# Patient Record
Sex: Female | Born: 1990 | Race: White | Hispanic: No | Marital: Single | Smoking: Never smoker
Health system: Southern US, Community
[De-identification: ages and names within clinical notes are randomized; demographics above are authoritative.]

## PROBLEM LIST (undated history)

## (undated) DIAGNOSIS — F329 Major depressive disorder, single episode, unspecified: Secondary | ICD-10-CM

## (undated) DIAGNOSIS — F419 Anxiety disorder, unspecified: Secondary | ICD-10-CM

## (undated) DIAGNOSIS — F32A Depression, unspecified: Secondary | ICD-10-CM

## (undated) HISTORY — DX: Depression, unspecified: F32.A

## (undated) HISTORY — DX: Major depressive disorder, single episode, unspecified: F32.9

## (undated) HISTORY — DX: Anxiety disorder, unspecified: F41.9

## (undated) HISTORY — PX: KNEE SURGERY: SHX244

---
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
## 2011-01-23 ENCOUNTER — Telehealth: Payer: Self-pay | Admitting: *Deleted

## 2011-01-23 NOTE — Telephone Encounter (Signed)
Patient called wanted to discuss her birth control with you.  She said she is about to loose insurance.

## 2011-01-23 NOTE — Telephone Encounter (Signed)
Jill Pham-Is losing her insurance but will hopefully get back in January. States one was on pills she was nauseous, has done well with nuva ring. Will pick up  2 samples.

## 2011-01-23 NOTE — Telephone Encounter (Signed)
Message left on cell to call 

## 2012-09-11 ENCOUNTER — Emergency Department (HOSPITAL_COMMUNITY)
Admission: EM | Admit: 2012-09-11 | Discharge: 2012-09-11 | Disposition: A | Discharge status: Home / Self Care | Payer: 59 | Attending: Emergency Medicine | Admitting: Emergency Medicine

## 2012-09-11 ENCOUNTER — Emergency Department (HOSPITAL_COMMUNITY): Payer: 59

## 2012-09-11 ENCOUNTER — Encounter (HOSPITAL_COMMUNITY): Payer: Self-pay | Admitting: *Deleted

## 2012-09-11 DIAGNOSIS — S0990XA Unspecified injury of head, initial encounter: Secondary | ICD-10-CM | POA: Insufficient documentation

## 2012-09-11 DIAGNOSIS — R110 Nausea: Secondary | ICD-10-CM | POA: Insufficient documentation

## 2012-09-11 DIAGNOSIS — R42 Dizziness and giddiness: Secondary | ICD-10-CM | POA: Insufficient documentation

## 2012-09-11 DIAGNOSIS — W64XXXA Exposure to other animate mechanical forces, initial encounter: Secondary | ICD-10-CM | POA: Insufficient documentation

## 2012-09-11 DIAGNOSIS — Y9389 Activity, other specified: Secondary | ICD-10-CM | POA: Insufficient documentation

## 2012-09-11 DIAGNOSIS — S0993XA Unspecified injury of face, initial encounter: Secondary | ICD-10-CM | POA: Insufficient documentation

## 2012-09-11 DIAGNOSIS — Y929 Unspecified place or not applicable: Secondary | ICD-10-CM | POA: Insufficient documentation

## 2012-09-11 MED ORDER — ONDANSETRON 4 MG PO TBDP
4.0000 mg | ORAL_TABLET | Freq: Once | ORAL | Status: AC
Start: 2012-09-11 — End: 2012-09-11
  Administered 2012-09-11: 4 mg via ORAL
  Filled 2012-09-11: qty 1

## 2012-09-11 MED ORDER — IBUPROFEN 800 MG PO TABS
800.0000 mg | ORAL_TABLET | Freq: Once | ORAL | Status: AC
  Administered 2012-09-11: 800 mg via ORAL
  Filled 2012-09-11: qty 1

## 2012-09-11 NOTE — ED Provider Notes (Signed)
History  This chart was scribed for Jill Lennert, MD by Bennett Scrape, ED Scribe. This patient was seen in room APA12/APA12 and the patient's care was started at 6:11 PM.  CSN: [id number]  Arrival date & time 09/11/12  1638   First MD Initiated Contact with Patient 09/11/12 1811      Chief Complaint  Patient presents with  . Head Injury     Patient is a 22 y.o. female presenting with head injury. The history is provided by the patient. No language interpreter was used.  Head Injury Location:  L parietal Time since incident:  1 hour Mechanism of injury: direct blow   Pain details:    Timing:  Constant Chronicity:  New Relieved by:  Nothing Worsened by:  Nothing tried Ineffective treatments:  None tried Associated symptoms: headache, nausea and neck pain   Associated symptoms: no blurred vision, no disorientation, no double vision, no loss of consciousness, no seizures and no vomiting     HPI Comments: Jill Pham is a 22 y.o. female who presents to the Emergency Department complaining of a head injury that occurred PTA. Pt was standing behind a horse when she was kicked in the left parietal area. She c/o associated left-sided HA, dizziness, neck pain and nausea. Pt denies LOC but says that she was knocked to the ground. She denies emesis, disorientation and visual disturbances as associated symptoms. Pt does not have a h/o chronic medical conditions.  History reviewed. No pertinent past medical history.  Past Surgical History  Procedure Laterality Date  . Knee surgery      No family history on file.  History  Substance Use Topics  . Smoking status: Never Smoker   . Smokeless tobacco: Not on file  . Alcohol Use: No    No OB history provided.  Review of Systems  Constitutional: Negative for appetite change and fatigue.  HENT: Positive for neck pain. Negative for congestion, sinus pressure and ear discharge.   Eyes: Negative for blurred vision, double  vision and discharge.  Respiratory: Negative for cough.   Cardiovascular: Negative for chest pain.  Gastrointestinal: Positive for nausea. Negative for vomiting, abdominal pain and diarrhea.  Genitourinary: Negative for frequency and hematuria.  Musculoskeletal: Negative for back pain.  Skin: Negative for rash.  Neurological: Positive for dizziness and headaches. Negative for seizures and loss of consciousness.  Psychiatric/Behavioral: Negative for hallucinations.    Allergies  Latex  Home Medications   Current Outpatient Rx  Name  Route  Sig  Dispense  Refill  . etonogestrel-ethinyl estradiol (NUVARING) 0.12-0.015 MG/24HR vaginal ring   Vaginal   Place 1 each vaginally every 28 (twenty-eight) days. Insert vaginally and leave in place for 3 consecutive weeks, then remove for 1 week.           Triage Vitals: BP 128/76  Pulse 97  Temp(Src) 98.2 F (36.8 C) (Oral)  Resp 20  Wt 105 lb (47.628 kg)  SpO2 99%  LMP 09/11/2012  Physical Exam  Nursing note and vitals reviewed. Constitutional: She is oriented to person, place, and time. She appears well-developed and well-nourished.  HENT:  Head: Normocephalic.  Tenderness to the vertex of the head with associated mild swelling  Eyes: Conjunctivae and EOM are normal. No scleral icterus.  Neck: Neck supple. No thyromegaly present.  Tenderness to posterior neck  Cardiovascular: Normal rate and regular rhythm.  Exam reveals no gallop and no friction rub.   No murmur heard. Pulmonary/Chest: Effort normal and breath  sounds normal. No stridor. She has no wheezes. She has no rales. She exhibits no tenderness.  Abdominal: Soft. She exhibits no distension. There is no tenderness. There is no rebound.  Musculoskeletal: Normal range of motion. She exhibits no edema.  Lymphadenopathy:    She has no cervical adenopathy.  Neurological: She is alert and oriented to person, place, and time. Coordination normal.  Skin: Skin is warm and dry.  No rash noted. No erythema.  Psychiatric: She has a normal mood and affect. Her behavior is normal.    ED Course  Procedures (including critical care time)  Medications  ondansetron (ZOFRAN-ODT) disintegrating tablet 4 mg (not administered)    DIAGNOSTIC STUDIES: Oxygen Saturation is 99% on room air, normal by my interpretation.    COORDINATION OF CARE: 6:19 PM-Discussed treatment plan which includes Zofran, CT of head and CT of c-spine with pt at bedside and pt agreed to plan.   8:09 PM-Informed pt of negative radiology results. Discussed discharge plan which includes OTC medications for pain with pt and pt agreed to plan. Also advised pt to follow up with PCP as needed and pt agreed. Addressed symptoms to return for with pt.   Labs Reviewed - No data to display  Ct Head Wo Contrast  09/11/2012   *RADIOLOGY REPORT*  Clinical Data:  Kicked in the left side of skull by horse. Symptoms of headache, dizziness, syncope and neck pain.  CT HEAD WITHOUT CONTRAST CT CERVICAL SPINE WITHOUT CONTRAST  Technique:  Multidetector CT imaging of the head and cervical spine was performed following the standard protocol without intravenous contrast.  Multiplanar CT image reconstructions of the cervical spine were also generated.  Comparison:   None  CT HEAD  Findings: The brain demonstrates no evidence of hemorrhage, infarction, edema, mass effect, extra-axial fluid collection, hydrocephalus or mass lesion.  The skull is unremarkable.  IMPRESSION: Normal head CT.  CT CERVICAL SPINE  Findings: The cervical spine shows normal alignment and no evidence of fracture or subluxation.  No soft tissue swelling or hematoma is identified.  The visualized airway is normally patent.  No degenerative changes are identified.  No bony lesions are seen.  IMPRESSION: Normal cervical spine CT.   Original Report Authenticated By: Irish Lack, M.D.   Ct Cervical Spine Wo Contrast  09/11/2012   *RADIOLOGY REPORT*  Clinical Data:   Kicked in the left side of skull by horse. Symptoms of headache, dizziness, syncope and neck pain.  CT HEAD WITHOUT CONTRAST CT CERVICAL SPINE WITHOUT CONTRAST  Technique:  Multidetector CT imaging of the head and cervical spine was performed following the standard protocol without intravenous contrast.  Multiplanar CT image reconstructions of the cervical spine were also generated.  Comparison:   None  CT HEAD  Findings: The brain demonstrates no evidence of hemorrhage, infarction, edema, mass effect, extra-axial fluid collection, hydrocephalus or mass lesion.  The skull is unremarkable.  IMPRESSION: Normal head CT.  CT CERVICAL SPINE  Findings: The cervical spine shows normal alignment and no evidence of fracture or subluxation.  No soft tissue swelling or hematoma is identified.  The visualized airway is normally patent.  No degenerative changes are identified.  No bony lesions are seen.  IMPRESSION: Normal cervical spine CT.   Original Report Authenticated By: Irish Lack, M.D.     No diagnosis found.    MDM      The chart was scribed for me under my direct supervision.  I personally performed the history, physical, and medical  decision making and all procedures in the evaluation of this patient.Jill Lennert, MD 09/11/12 2013

## 2012-09-11 NOTE — ED Notes (Signed)
Pt was kicked in head by a horse, c/o headache, dizziness, nausea. Unsure of any LOC.

## 2012-09-15 ENCOUNTER — Encounter: Payer: Self-pay | Admitting: General Practice

## 2012-09-15 ENCOUNTER — Ambulatory Visit (INDEPENDENT_AMBULATORY_CARE_PROVIDER_SITE_OTHER): Payer: 59 | Admitting: General Practice

## 2012-09-15 VITALS — BP 114/72 | HR 73 | Temp 99.0°F | Ht 61.0 in | Wt 118.0 lb

## 2012-09-15 DIAGNOSIS — IMO0001 Reserved for inherently not codable concepts without codable children: Secondary | ICD-10-CM

## 2012-09-15 DIAGNOSIS — Z30011 Encounter for initial prescription of contraceptive pills: Secondary | ICD-10-CM

## 2012-09-15 DIAGNOSIS — Z3009 Encounter for other general counseling and advice on contraception: Secondary | ICD-10-CM

## 2012-09-15 DIAGNOSIS — Z309 Encounter for contraceptive management, unspecified: Secondary | ICD-10-CM

## 2012-09-15 MED ORDER — NORGESTIM-ETH ESTRAD TRIPHASIC 0.18/0.215/0.25 MG-35 MCG PO TABS: 1.0000 | ORAL_TABLET | Freq: Every day | ORAL | Status: DC

## 2012-09-15 NOTE — Patient Instructions (Addendum)
Contraception Choices Birth control (contraception) can stop pregnancy from happening. Different types of birth control work in different ways. Some can:  Make the mucus in the cervix thick. This makes it hard for sperm to get into the uterus.  Thin the lining of the uterus. This makes it hard for an egg to attach to the wall of the uterus.  Stop the ovaries from releasing an egg.  Block the sperm from reaching the egg. Certain types of surgery can stop pregnancy from happening. For women, the sugery closes the fallopian tubes (tubal ligation). For men, the surgery stops sperm from releasing during sex (vasectomy). HORMONAL BIRTH CONTROL Hormonal birth control stops pregnancy by putting hormones into your body. Types of birth control include:  A small tube put under the skin of the upper arm (implant). The tube can stay in place for 3 years.  Shots given every 3 months.  Pills taken every day or once after sex (intercourse).  Patches that are changed once a week.  A ring put into the vagina (vaginal ring). The ring is left in place for 3 weeks and removed for 1 week. Then, a new ring is put in the vagina. BARRIER BIRTH CONTROL  Barrier birth control blocks sperm from reaching the egg. Types of birth control include:   A thin covering worn on the penis (female condom) during sex.  A soft, loose covering put into the vagina (female condom) before sex.  A rubber bowl that sits over the cervix (diaphragm). The bowl must be made for you. The bowl is put into the vagina before sex. The bowl is left in place for 6 to 8 hours after sex.  A small, soft cup that fits over the cervix (cervical cap). The cup must be made for you. The cup can be left in place for 48 hours after sex.  A sponge that is put into the vagina before sex.  A chemical that kills or blocks sperm from getting into the cervix and uterus (spermicide). The chemical may be a cream, jelly, foam, or pill. INTRAUTERINE (IUD)  BIRTH CONTROL  IUD birth control is a small, T-shaped piece of plastic. The plastic is put inside the uterus. There are 2 types of IUD:  Copper IUD. The IUD is covered in copper wire. The copper makes a fluid that kills sperm. It can stay in place for 10 years.  Hormone IUD. The hormone stops pregnancy from happening. It can stay in place for 5 years. NATURAL FAMILY PLANNING BIRTH CONTROL  Natural family planning means not having sex or using barrier birth control when the woman is fertile. A woman can:  Use a calendar to keep track of when she is fertile.  Use a thermometer to measure her body temperature. Protect yourself against sexual diseases no matter what type of birth control you use. Talk to your doctor about which type of birth control is best for you. Document Released: 01/18/2009 Document Revised: 06/15/2011 Document Reviewed: 07/30/2010 [facility] Patient Information 2014 [place], [place].

## 2012-09-15 NOTE — Progress Notes (Signed)
  Subjective:    Patient ID: Jill Pham, female    DOB: 11/21/1990, 21 y.o.   MRN: [id number]  HPI Patient presents today to discuss and possible change her birth control method. She is currently using the nuvaring and finds it expensive. She reports using as directed and denies known pregnancy. She reports last menstrual cycle started on September 08, 2012. Denies smoking.    Review of Systems  Constitutional: Negative for fever and chills.  HENT: Negative for neck pain.   Respiratory: Negative for chest tightness and shortness of breath.   Cardiovascular: Negative for chest pain and palpitations.  Gastrointestinal: Negative for abdominal pain and blood in stool.  Genitourinary: Negative for vaginal bleeding, vaginal discharge, difficulty urinating, vaginal pain and pelvic pain.  Musculoskeletal: Negative for myalgias and back pain.  Neurological: Negative for dizziness, syncope, weakness and headaches.       Objective:   Physical Exam  Constitutional: She is oriented to person, place, and time. She appears well-developed and well-nourished.  HENT:  Head: Normocephalic and atraumatic.  Cardiovascular: Normal rate, regular rhythm and normal heart sounds.   Pulmonary/Chest: Effort normal and breath sounds normal. No respiratory distress. She exhibits no tenderness.  Abdominal: Soft. Bowel sounds are normal. She exhibits no distension. There is no tenderness.  Neurological: She is alert and oriented to person, place, and time.  Skin: Skin is warm and dry.  Psychiatric: She has a normal mood and affect.   Results for orders placed in visit on 09/15/12  POCT URINE PREGNANCY      Result Value Range   Preg Test, Ur Negative           Assessment & Plan:  1. Birth control and 2. BCP (birth control pills) initiation - POCT urine pregnancy - Norgestimate-Ethinyl Estradiol Triphasic 0.18/0.215/0.25 MG-35 MCG tablet; Take 1 tablet by mouth daily.  Dispense: 3 Package; Refill:  3 -discussed cardiac risks, especially if smoking -discussed when to start taking birth control medications -discussed how to take medication if dose missed and use back up contraceptive method -Patient verbalized understanding -Coralie Keens, FNP-C

## 2012-10-11 ENCOUNTER — Telehealth: Payer: Self-pay | Admitting: Family Medicine

## 2012-10-11 NOTE — Telephone Encounter (Signed)
appt made for wc rck

## 2012-12-12 ENCOUNTER — Telehealth: Payer: Self-pay | Admitting: General Practice

## 2012-12-12 NOTE — Telephone Encounter (Signed)
Advised patient that no more appts available for today. Offered appt for tomorrow. Wanted to ask if antibiotic can be called in for her because she has such a long history of strep infections and she is sure that is what it is. I advised that we normally don't call in abts without patient being seen but patient requested that provider be asked

## 2012-12-13 ENCOUNTER — Ambulatory Visit (INDEPENDENT_AMBULATORY_CARE_PROVIDER_SITE_OTHER): Payer: PRIVATE HEALTH INSURANCE | Admitting: Nurse Practitioner

## 2012-12-13 VITALS — BP 106/73 | HR 92 | Temp 99.0°F | Ht 61.0 in | Wt 116.0 lb

## 2012-12-13 DIAGNOSIS — J039 Acute tonsillitis, unspecified: Secondary | ICD-10-CM

## 2012-12-13 DIAGNOSIS — J029 Acute pharyngitis, unspecified: Secondary | ICD-10-CM

## 2012-12-13 LAB — POCT RAPID STREP A (OFFICE): Rapid Strep A Screen: NEGATIVE

## 2012-12-13 MED ORDER — AMOXICILLIN 875 MG PO TABS: 875.0000 mg | ORAL_TABLET | Freq: Two times a day (BID) | ORAL | Status: DC

## 2012-12-13 NOTE — Progress Notes (Signed)
  Subjective:    Patient ID: Jill Pham, female    DOB: 1990/10/02, 21 y.o.   MRN: [id number]  HPI patient in C/O sore throat that started this past Saturday and has gradually gotten worse. Hurts to swallow. Low grade fever. Woke up with bilateral ear pain this morning. Dayquil and nitequil OTC with no relief.    Review of Systems  Constitutional: Positive for fever. Negative for appetite change and unexpected weight change.  HENT: Positive for ear pain, sore throat and trouble swallowing. Negative for congestion, rhinorrhea, sneezing, voice change, postnasal drip, sinus pressure, tinnitus and ear discharge.   Respiratory: Negative for cough.   All other systems reviewed and are negative.       Objective:   Physical Exam  Constitutional: She appears well-developed and well-nourished.  HENT:  Right Ear: Hearing, tympanic membrane, external ear and ear canal normal.  Left Ear: Hearing, tympanic membrane, external ear and ear canal normal.  Nose: Mucosal edema and rhinorrhea present. Right sinus exhibits no maxillary sinus tenderness and no frontal sinus tenderness. Left sinus exhibits no maxillary sinus tenderness and no frontal sinus tenderness.  Mouth/Throat: Oropharyngeal exudate, posterior oropharyngeal edema and posterior oropharyngeal erythema present.  Neck: Normal range of motion. Neck supple.  Cardiovascular: Normal rate, regular rhythm and normal heart sounds.   Pulmonary/Chest: Effort normal and breath sounds normal.  Skin: Skin is warm and dry.    BP 106/73  Pulse 92  Temp(Src) 99 F (37.2 C) (Oral)  Ht 5\' 1"  (1.549 m)  Wt 116 lb (52.617 kg)  BMI 21.93 kg/m2  LMP 11/13/2012 Results for orders placed in visit on 12/13/12  POCT RAPID STREP A (OFFICE)      Result Value Range   Rapid Strep A Screen Negative  Negative         Assessment & Plan:   1. Tonsillitis with exudate   2. Sore throat    Meds ordered this encounter  Medications  . amoxicillin  (AMOXIL) 875 MG tablet    Sig: Take 1 tablet (875 mg total) by mouth 2 (two) times daily.    Dispense:  20 tablet    Refill:  0    Order Specific Question:  Supervising Provider    Answer:  Deborra Medina  Force fluids Motrin or tylenol OTC OTC decongestant Throat lozenges if help New toothbrush in 3 days Mary-Margaret Daphine Deutscher, FNP

## 2012-12-13 NOTE — Patient Instructions (Signed)
Force fluids °Motrin or tylenol OTC °OTC decongestant °Throat lozenges if help °New toothbrush in 3 days ° °

## 2013-01-04 ENCOUNTER — Other Ambulatory Visit: Payer: Self-pay | Admitting: Obstetrics & Gynecology

## 2013-01-04 DIAGNOSIS — O3680X0 Pregnancy with inconclusive fetal viability, not applicable or unspecified: Secondary | ICD-10-CM

## 2013-01-10 ENCOUNTER — Other Ambulatory Visit: Payer: PRIVATE HEALTH INSURANCE

## 2013-01-10 ENCOUNTER — Encounter: Payer: PRIVATE HEALTH INSURANCE | Admitting: Women's Health

## 2014-02-13 ENCOUNTER — Other Ambulatory Visit: Payer: Self-pay | Admitting: Family Medicine

## 2014-02-13 NOTE — Telephone Encounter (Signed)
Patient aware to make an appointment she has already paid her bill paid it this morning. She said she will call back to make an appointment with a female Jill Pham only does paps on Tuesday.

## 2014-02-13 NOTE — Telephone Encounter (Signed)
NTBS for PAP 

## 2014-02-13 NOTE — Telephone Encounter (Signed)
Please advise the birth control ? Patient is dismissed from this department. Owes $126.07 if she pays it she can be seen.

## 2014-02-20 ENCOUNTER — Ambulatory Visit (INDEPENDENT_AMBULATORY_CARE_PROVIDER_SITE_OTHER): Payer: Managed Care, Other (non HMO) | Admitting: Nurse Practitioner

## 2014-02-20 ENCOUNTER — Encounter: Payer: Self-pay | Admitting: Nurse Practitioner

## 2014-02-20 VITALS — BP 122/79 | HR 91 | Temp 99.1°F | Ht 61.0 in | Wt 133.0 lb

## 2014-02-20 DIAGNOSIS — Z01419 Encounter for gynecological examination (general) (routine) without abnormal findings: Secondary | ICD-10-CM

## 2014-02-20 DIAGNOSIS — R7309 Other abnormal glucose: Secondary | ICD-10-CM

## 2014-02-20 LAB — POCT HEMOGLOBIN: Hemoglobin: 12.8 g/dL (ref 12.2–16.2)

## 2014-02-20 MED ORDER — ETONOGESTREL-ETHINYL ESTRADIOL 0.12-0.015 MG/24HR VA RING: VAGINAL_RING | VAGINAL | Status: DC

## 2014-02-20 NOTE — Progress Notes (Signed)
   Subjective:    Patient ID: Jill Pham, female    DOB: 08/20/1990, 23 y.o.   MRN: [id number]  HPI Patient wants to go back on nuvaring and needs PAP today- She is doing well today without complaints.    Review of Systems  Constitutional: Negative.   HENT: Negative.   Respiratory: Negative.   Cardiovascular: Negative.   Gastrointestinal: Negative.   Genitourinary: Negative.   Neurological: Negative.   Psychiatric/Behavioral: Negative.   All other systems reviewed and are negative.      Objective:   Physical Exam  Constitutional: She is oriented to person, place, and time. She appears well-developed and well-nourished.  HENT:  Head: Normocephalic.  Right Ear: Hearing, tympanic membrane, external ear and ear canal normal.  Left Ear: Hearing, tympanic membrane, external ear and ear canal normal.  Nose: Nose normal.  Mouth/Throat: Uvula is midline and oropharynx is clear and moist.  Eyes: Conjunctivae and EOM are normal. Pupils are equal, round, and reactive to light.  Neck: Trachea normal, normal range of motion and full passive range of motion without pain. Neck supple. No JVD present. Carotid bruit is not present. No thyroid mass and no thyromegaly present.  Cardiovascular: Normal rate, regular rhythm, normal heart sounds and intact distal pulses.  Exam reveals no gallop and no friction rub.   No murmur heard. Pulmonary/Chest: Effort normal and breath sounds normal. Right breast exhibits no inverted nipple, no mass, no nipple discharge, no skin change and no tenderness. Left breast exhibits no inverted nipple, no mass, no nipple discharge, no skin change and no tenderness.  Abdominal: Soft. Bowel sounds are normal. She exhibits no distension and no mass. There is no tenderness.  Genitourinary: Vagina normal and uterus normal. No breast swelling, tenderness, discharge or bleeding.  bimanual exam-No adnexal masses or tenderness. Cervix nonparous and pink     Musculoskeletal: Normal range of motion.  Lymphadenopathy:    She has no cervical adenopathy.  Neurological: She is alert and oriented to person, place, and time. She has normal reflexes.  Skin: Skin is warm and dry.  Psychiatric: She has a normal mood and affect. Her behavior is normal. Judgment and thought content normal.    BP 122/79 mmHg  Pulse 91  Temp(Src) 99.1 F (37.3 C) (Oral)  Ht 5\' 1"  (1.549 m)  Wt 133 lb (60.328 kg)  BMI 25.14 kg/m2        Assessment & Plan:  1. Encounter for routine gynecological examination Meds ordered this encounter  Medications  . DISCONTD: etonogestrel-ethinyl estradiol (NUVARING) 0.12-0.015 MG/24HR vaginal ring    Sig: Place 1 each vaginally.  Marland Kitchen. etonogestrel-ethinyl estradiol (NUVARING) 0.12-0.015 MG/24HR vaginal ring    Sig: Per vagina- leave 3 weeks then remove- repeat after 1 week    Dispense:  1 each    Refill:  11    Order Specific Question:  Supervising Provider    Answer:  Ernestina PennaMOORE, DONALD W [[id number]]    - POCT hemoglobin - Pap IG, CT/NG w/ reflex HPV when ASC-U  Labs pending Health maintenance reviewed Diet and exercise encouraged Continue all meds Follow up  In 1 year and prn    Mary-Margaret Daphine DeutscherMartin, FNP

## 2014-02-20 NOTE — Patient Instructions (Signed)

## 2014-02-23 LAB — PAP IG, CT-NG, RFX HPV ASCU
Chlamydia, Nuc. Acid Amp: NEGATIVE
GONOCOCCUS BY NUCLEIC ACID AMP: NEGATIVE
PAP SMEAR COMMENT: 0

## 2014-02-26 ENCOUNTER — Telehealth: Payer: Self-pay

## 2014-02-26 NOTE — Telephone Encounter (Signed)
[facility] to X-ray 

## 2014-02-26 NOTE — Telephone Encounter (Signed)
-----   Message from [facility]enMary-Margaret Martin, FNP sent at 02/24/2014  8:14 AM EST ----- hgb okay PAP abnormal- LISIL- Need to repeat in 6  Months- will make reminder to let her know when needs to be repeated

## 2014-02-26 NOTE — Telephone Encounter (Signed)
lmtcb regarding pap results and hgb.

## 2014-06-11 DIAGNOSIS — F329 Major depressive disorder, single episode, unspecified: Secondary | ICD-10-CM | POA: Insufficient documentation

## 2014-06-11 DIAGNOSIS — R454 Irritability and anger: Secondary | ICD-10-CM | POA: Insufficient documentation

## 2014-06-11 DIAGNOSIS — F32A Depression, unspecified: Secondary | ICD-10-CM | POA: Insufficient documentation

## 2014-07-25 ENCOUNTER — Telehealth: Payer: Self-pay | Admitting: Nurse Practitioner

## 2014-07-25 NOTE — Telephone Encounter (Signed)
Printed pap results and placed in drawer for patient to pick up

## 2014-08-01 DIAGNOSIS — F5101 Primary insomnia: Secondary | ICD-10-CM | POA: Insufficient documentation

## 2015-03-18 ENCOUNTER — Telehealth: Payer: Self-pay | Admitting: Nurse Practitioner

## 2015-03-18 NOTE — Telephone Encounter (Signed)
Printed patient's shot record. Put copy in drawer to pick up. jb

## 2015-05-05 IMAGING — CR DG WRIST COMPLETE 3+V*R*
3 series · 3 of 3 positions shown · non-contrast
Comparison: None.

CLINICAL DATA: History of painful wrist.

EXAM:
RIGHT WRIST - COMPLETE 3+ VIEW

[view not recorded (1 of 3)]
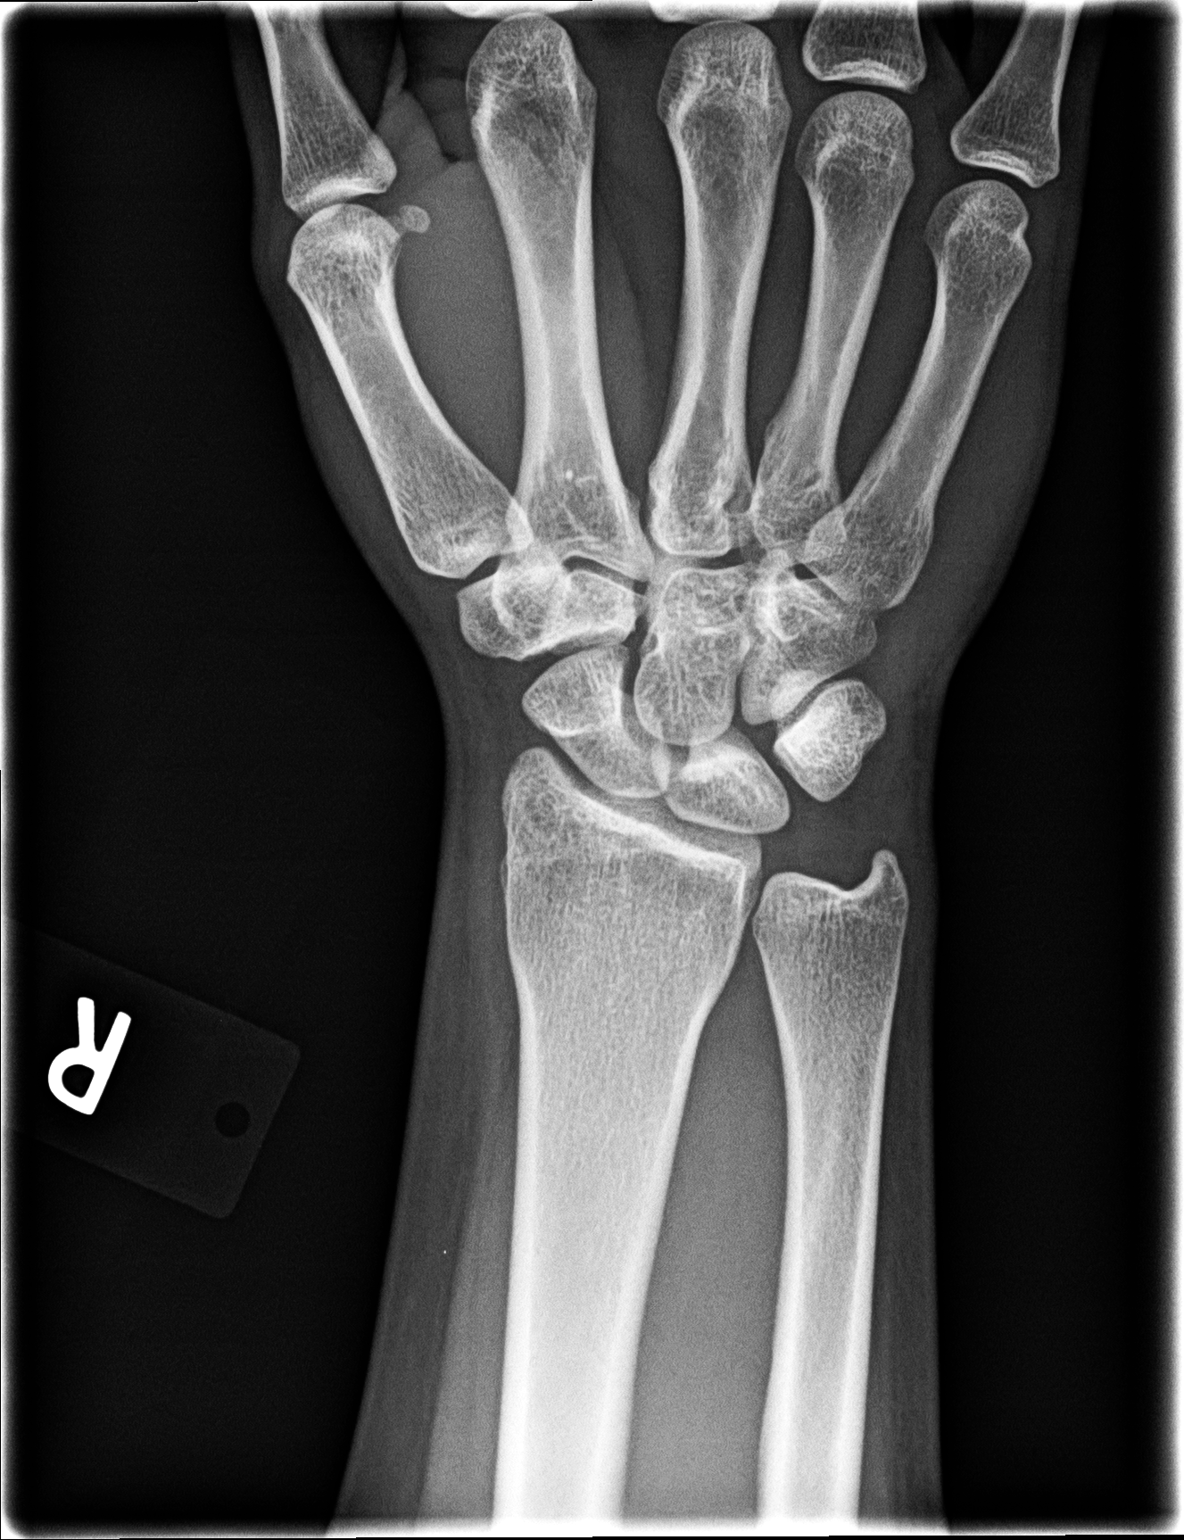

[view not recorded (2 of 3)]
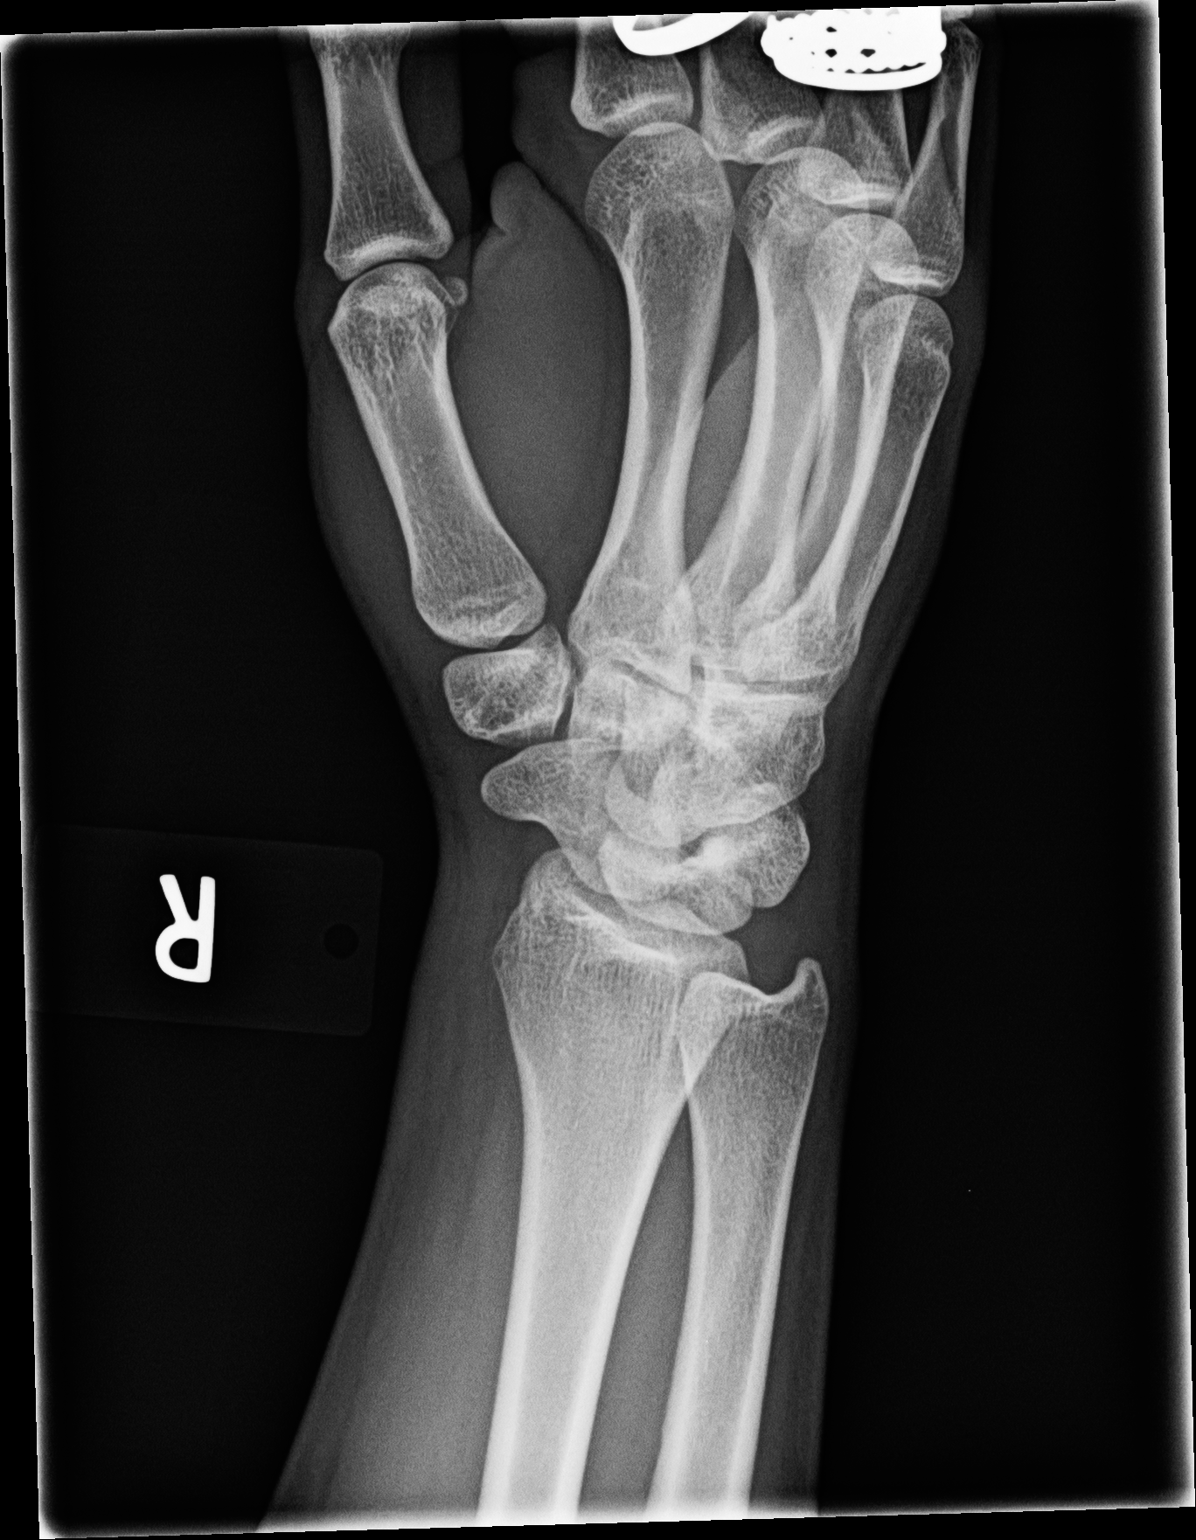

[view not recorded (3 of 3)]
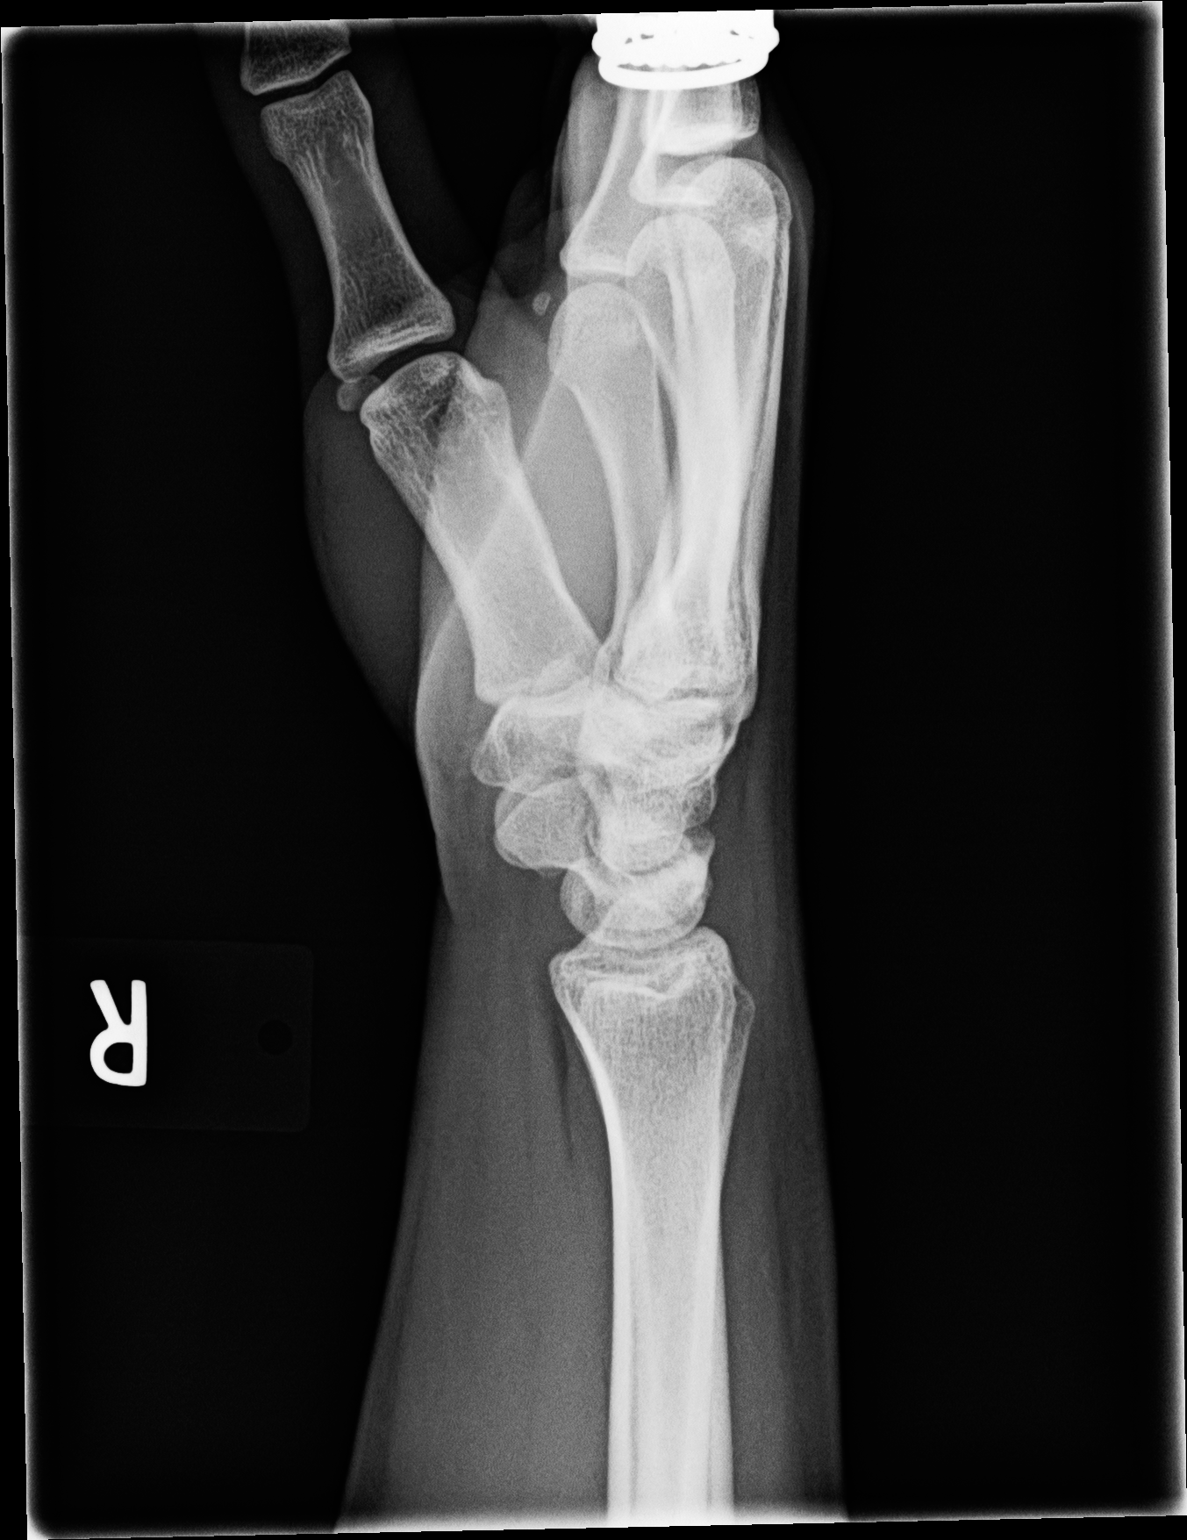

[3 of 3 positions shown; findings below may reference images not displayed]

FINDINGS: Alignment is normal. Joint spaces are preserved. No fracture or
dislocation is evident. No soft tissue lesions are seen. Scaphoid
appears intact. No chondrocalcinosis is evident. No erosive changes
are seen
IMPRESSION: No wrist abnormalities are identified.

## 2015-06-28 ENCOUNTER — Telehealth: Payer: Self-pay | Admitting: Family Medicine

## 2015-07-03 NOTE — Telephone Encounter (Signed)
Patient notified of records done.

## 2015-07-03 NOTE — Telephone Encounter (Signed)
[facility] done records 06-27-15

## 2016-01-22 ENCOUNTER — Ambulatory Visit (INDEPENDENT_AMBULATORY_CARE_PROVIDER_SITE_OTHER): Payer: Managed Care, Other (non HMO) | Admitting: Women's Health

## 2016-01-22 ENCOUNTER — Encounter: Payer: Self-pay | Admitting: Women's Health

## 2016-01-22 VITALS — BP 118/78 | Ht 61.0 in | Wt 140.0 lb

## 2016-01-22 DIAGNOSIS — Z1329 Encounter for screening for other suspected endocrine disorder: Secondary | ICD-10-CM | POA: Diagnosis not present

## 2016-01-22 DIAGNOSIS — Z01419 Encounter for gynecological examination (general) (routine) without abnormal findings: Secondary | ICD-10-CM | POA: Diagnosis not present

## 2016-01-22 LAB — CBC WITH DIFFERENTIAL/PLATELET
BASOS PCT: 1 %
Basophils Absolute: 79 cells/uL (ref 0–200)
EOS ABS: 158 {cells}/uL (ref 15–500)
Eosinophils Relative: 2 %
HCT: 41.4 % (ref 35.0–45.0)
Hemoglobin: 13.9 g/dL (ref 11.7–15.5)
Lymphocytes Relative: 25 %
Lymphs Abs: 1975 cells/uL (ref 850–3900)
MCH: 29.8 pg (ref 27.0–33.0)
MCHC: 33.6 g/dL (ref 32.0–36.0)
MCV: 88.7 fL (ref 80.0–100.0)
MONO ABS: 711 {cells}/uL (ref 200–950)
MONOS PCT: 9 %
MPV: 9.1 fL (ref 7.5–12.5)
NEUTROS ABS: 4977 {cells}/uL (ref 1500–7800)
Neutrophils Relative %: 63 %
PLATELETS: 246 10*3/uL (ref 140–400)
RBC: 4.67 MIL/uL (ref 3.80–5.10)
RDW: 12.9 % (ref 11.0–15.0)
WBC: 7.9 10*3/uL (ref 3.8–10.8)

## 2016-01-22 LAB — URINALYSIS W MICROSCOPIC + REFLEX CULTURE
Bacteria, UA: NONE SEEN [HPF]
Bilirubin Urine: NEGATIVE
CASTS: NONE SEEN [LPF]
CRYSTALS: NONE SEEN [HPF]
Glucose, UA: NEGATIVE
HGB URINE DIPSTICK: NEGATIVE
Ketones, ur: NEGATIVE
Leukocytes, UA: NEGATIVE
NITRITE: NEGATIVE
PH: 7.5 (ref 5.0–8.0)
Protein, ur: NEGATIVE
RBC / HPF: NONE SEEN RBC/HPF (ref <=2)
SPECIFIC GRAVITY, URINE: 1.023 (ref 1.001–1.035)
WBC, UA: NONE SEEN WBC/HPF (ref <=5)
YEAST: NONE SEEN [HPF]

## 2016-01-22 LAB — TSH: TSH: 1.5 mIU/L

## 2016-01-22 NOTE — Addendum Note (Signed)
Addended by: Kem ParkinsonBARNES, Roneshia Drew on: 01/22/2016 11:41 AM   Modules accepted: Orders

## 2016-01-22 NOTE — Patient Instructions (Signed)
Health Maintenance, Female Adopting a healthy lifestyle and getting preventive care can go a long way to promote health and wellness. Talk with your health care provider about what schedule of regular examinations is right for you. This is a good chance for you to check in with your provider about disease prevention and staying healthy. In between checkups, there are plenty of things you can do on your own. Experts have done a lot of research about which lifestyle changes and preventive measures are most likely to keep you healthy. Ask your health care provider for more information. WEIGHT AND DIET  Eat a healthy diet  Be sure to include plenty of vegetables, fruits, low-fat dairy products, and lean protein.  Do not eat a lot of foods high in solid fats, added sugars, or salt.  Get regular exercise. This is one of the most important things you can do for your health.  Most adults should exercise for at least 150 minutes each week. The exercise should increase your heart rate and make you sweat (moderate-intensity exercise).  Most adults should also do strengthening exercises at least twice a week. This is in addition to the moderate-intensity exercise.  Maintain a healthy weight  Body mass index (BMI) is a measurement that can be used to identify possible weight problems. It estimates body fat based on height and weight. Your health care provider can help determine your BMI and help you achieve or maintain a healthy weight.  For females 20 years of age and older:   A BMI below 18.5 is considered underweight.  A BMI of 18.5 to 24.9 is normal.  A BMI of 25 to 29.9 is considered overweight.  A BMI of 30 and above is considered obese.  Watch levels of cholesterol and blood lipids  You should start having your blood tested for lipids and cholesterol at 24 years of age, then have this test every 5 years.  You may need to have your cholesterol levels checked more often if:  Your lipid  or cholesterol levels are high.  You are older than 24 years of age.  You are at high risk for heart disease.  CANCER SCREENING   Lung Cancer  Lung cancer screening is recommended for adults 55-80 years old who are at high risk for lung cancer because of a history of smoking.  A yearly low-dose CT scan of the lungs is recommended for people who:  Currently smoke.  Have quit within the past 15 years.  Have at least a 30-pack-year history of smoking. A pack year is smoking an average of one pack of cigarettes a day for 1 year.  Yearly screening should continue until it has been 15 years since you quit.  Yearly screening should stop if you develop a health problem that would prevent you from having lung cancer treatment.  Breast Cancer  Practice breast self-awareness. This means understanding how your breasts normally appear and feel.  It also means doing regular breast self-exams. Let your health care provider know about any changes, no matter how small.  If you are in your 20s or 30s, you should have a clinical breast exam (CBE) by a health care provider every 1-3 years as part of a regular health exam.  If you are 40 or older, have a CBE every year. Also consider having a breast X-ray (mammogram) every year.  If you have a family history of breast cancer, talk to your health care provider about genetic screening.  If you   are at high risk for breast cancer, talk to your health care provider about having an MRI and a mammogram every year.  Breast cancer gene (BRCA) assessment is recommended for women who have family members with BRCA-related cancers. BRCA-related cancers include:  Breast.  Ovarian.  Tubal.  Peritoneal cancers.  Results of the assessment will determine the need for genetic counseling and BRCA1 and BRCA2 testing. Cervical Cancer Your health care provider may recommend that you be screened regularly for cancer of the pelvic organs (ovaries, uterus, and  vagina). This screening involves a pelvic examination, including checking for microscopic changes to the surface of your cervix (Pap test). You may be encouraged to have this screening done every 3 years, beginning at age 21.  For women ages 30-65, health care providers may recommend pelvic exams and Pap testing every 3 years, or they may recommend the Pap and pelvic exam, combined with testing for human papilloma virus (HPV), every 5 years. Some types of HPV increase your risk of cervical cancer. Testing for HPV may also be done on women of any age with unclear Pap test results.  Other health care providers may not recommend any screening for nonpregnant women who are considered low risk for pelvic cancer and who do not have symptoms. Ask your health care provider if a screening pelvic exam is right for you.  If you have had past treatment for cervical cancer or a condition that could lead to cancer, you need Pap tests and screening for cancer for at least 20 years after your treatment. If Pap tests have been discontinued, your risk factors (such as having a new sexual partner) need to be reassessed to determine if screening should resume. Some women have medical problems that increase the chance of getting cervical cancer. In these cases, your health care provider may recommend more frequent screening and Pap tests. Colorectal Cancer  This type of cancer can be detected and often prevented.  Routine colorectal cancer screening usually begins at 24 years of age and continues through 24 years of age.  Your health care provider may recommend screening at an earlier age if you have risk factors for colon cancer.  Your health care provider may also recommend using home test kits to check for hidden blood in the stool.  A small camera at the end of a tube can be used to examine your colon directly (sigmoidoscopy or colonoscopy). This is done to check for the earliest forms of colorectal  cancer.  Routine screening usually begins at age 50.  Direct examination of the colon should be repeated every 5-10 years through 24 years of age. However, you may need to be screened more often if early forms of precancerous polyps or small growths are found. Skin Cancer  Check your skin from head to toe regularly.  Tell your health care provider about any new moles or changes in moles, especially if there is a change in a mole's shape or color.  Also tell your health care provider if you have a mole that is larger than the size of a pencil eraser.  Always use sunscreen. Apply sunscreen liberally and repeatedly throughout the day.  Protect yourself by wearing long sleeves, pants, a wide-brimmed hat, and sunglasses whenever you are outside. HEART DISEASE, DIABETES, AND HIGH BLOOD PRESSURE   High blood pressure causes heart disease and increases the risk of stroke. High blood pressure is more likely to develop in:  People who have blood pressure in the high end   of the normal range (130-139/85-89 mm Hg).  People who are overweight or obese.  People who are African American.  If you are 38-23 years of age, have your blood pressure checked every 3-5 years. If you are 61 years of age or older, have your blood pressure checked every year. You should have your blood pressure measured twice--once when you are at a hospital or clinic, and once when you are not at a hospital or clinic. Record the average of the two measurements. To check your blood pressure when you are not at a hospital or clinic, you can use:  An automated blood pressure machine at a pharmacy.  A home blood pressure monitor.  If you are between 45 years and 39 years old, ask your health care provider if you should take aspirin to prevent strokes.  Have regular diabetes screenings. This involves taking a blood sample to check your fasting blood sugar level.  If you are at a normal weight and have a low risk for diabetes,  have this test once every three years after 24 years of age.  If you are overweight and have a high risk for diabetes, consider being tested at a younger age or more often. PREVENTING INFECTION  Hepatitis B  If you have a higher risk for hepatitis B, you should be screened for this virus. You are considered at high risk for hepatitis B if:  You were born in a country where hepatitis B is common. Ask your health care provider which countries are considered high risk.  Your parents were born in a high-risk country, and you have not been immunized against hepatitis B (hepatitis B vaccine).  You have HIV or AIDS.  You use needles to inject street drugs.  You live with someone who has hepatitis B.  You have had sex with someone who has hepatitis B.  You get hemodialysis treatment.  You take certain medicines for conditions, including cancer, organ transplantation, and autoimmune conditions. Hepatitis C  Blood testing is recommended for:  Everyone born from [birth year] through 1964.  Anyone with known risk factors for hepatitis C. Sexually transmitted infections (STIs)  You should be screened for sexually transmitted infections (STIs) including gonorrhea and chlamydia if:  You are sexually active and are younger than 24 years of age.  You are older than 24 years of age and your health care provider tells you that you are at risk for this type of infection.  Your sexual activity has changed since you were last screened and you are at an increased risk for chlamydia or gonorrhea. Ask your health care provider if you are at risk.  If you do not have HIV, but are at risk, it may be recommended that you take a prescription medicine daily to prevent HIV infection. This is called pre-exposure prophylaxis (PrEP). You are considered at risk if:  You are sexually active and do not regularly use condoms or know the HIV status of your partner(s).  You take drugs by injection.  You are sexually  active with a partner who has HIV. Talk with your health care provider about whether you are at high risk of being infected with HIV. If you choose to begin PrEP, you should first be tested for HIV. You should then be tested every 3 months for as long as you are taking PrEP.  PREGNANCY   If you are premenopausal and you may become pregnant, ask your health care provider about preconception counseling.  If you may  become pregnant, take 400 to 800 micrograms (mcg) of folic acid every day.  If you want to prevent pregnancy, talk to your health care provider about birth control (contraception). OSTEOPOROSIS AND MENOPAUSE   Osteoporosis is a disease in which the bones lose minerals and strength with aging. This can result in serious bone fractures. Your risk for osteoporosis can be identified using a bone density scan.  If you are 61 years of age or older, or if you are at risk for osteoporosis and fractures, ask your health care provider if you should be screened.  Ask your health care provider whether you should take a calcium or vitamin D supplement to lower your risk for osteoporosis.  Menopause may have certain physical symptoms and risks.  Hormone replacement therapy may reduce some of these symptoms and risks. Talk to your health care provider about whether hormone replacement therapy is right for you.  HOME CARE INSTRUCTIONS   Schedule regular health, dental, and eye exams.  Stay current with your immunizations.   Do not use any tobacco products including cigarettes, chewing tobacco, or electronic cigarettes.  If you are pregnant, do not drink alcohol.  If you are breastfeeding, limit how much and how often you drink alcohol.  Limit alcohol intake to no more than 1 drink per day for nonpregnant women. One drink equals 12 ounces of beer, 5 ounces of wine, or 1 ounces of hard liquor.  Do not use street drugs.  Do not share needles.  Ask your health care provider for help if  you need support or information about quitting drugs.  Tell your health care provider if you often feel depressed.  Tell your health care provider if you have ever been abused or do not feel safe at home.   This information is not intended to replace advice given to you by your health care provider. Make sure you discuss any questions you have with your health care provider.   Document Released: 10/06/2010 Document Revised: 04/13/2014 Document Reviewed: 02/22/2013 Elsevier Interactive Patient Education [employer].

## 2016-01-22 NOTE — Progress Notes (Signed)
Jill Pham 13-Jan-1991 [id number]    History:    Presents for annual exam. [facility]  spotting first month and now amenorrhea, sexually active with 1 partner for 2 years. Reports having sharp pain after insertion, strings not seen at follow-up ultrasound confirmed proper placement..02/2015 [facility] reports follow up pap done 6 months later was normal. Reports weight 20 pound weight gain since last year but works out 5 days a week. Has had Gardasil series. Will receive Flu vaccine through work.   Past medical history, past surgical history, family history and social history were all reviewed and documented in the EPIC chart. 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 Boyfriend is bodybuilder. Father has history of 2 strokes and MI, paternal grandmother with history of Breast Caner.  ROS:  A ROS was performed and pertinent positives and negatives are included.  Exam:  Vitals:   01/22/16 0950  BP: 118/78  Weight: 140 lb (63.5 kg)  Height: 5\' 1"  (1.549 m)   Body mass index is 26.45 kg/m.   General appearance:  Appears well. Thyroid:  Symmetrical, normal in size, without palpable masses or nodularity. Respiratory  Auscultation:  Clear without wheezing or rhonchi Cardiovascular  Auscultation:  Regular rate, without rubs, murmurs or gallops  Edema/varicosities:  Not grossly evident Abdominal  Soft,nontender, without masses, guarding or rebound.  Liver/spleen:  No organomegaly noted  Hernia:  None appreciated  Skin  Inspection:  Grossly normal   Breasts: Examined lying and sitting.     Right: Without masses, retractions, discharge or axillary adenopathy.     Left: Without masses, retractions, discharge or axillary adenopathy. Gentitourinary   Inguinal/mons:  Normal without inguinal adenopathy  External genitalia:  Normal  BUS/Urethra/Skene's glands:  Normal  Vagina:   Normal  Cervix:  Norma lIUD strings not seen  Uterus:  anteverted, normal in size, shape and contour.  Midline and mobile  Adnexa/parametria:     Rt: Without masses or tenderness.   Lt: Without masses or tenderness.  Anus and perineum: Normal  Digital rectal exam: Normal sphincter tone without palpated masses or tenderness  Assessment/Plan:  25 y.o.  for annual exam.   SWF G0P0  02/2015 [facility] with normal after 12/2015 [name] amenorrhea Unexplained weight gain  Plan: Encouraged SBE's. Encouraged to continue exercise and healthy eating habits. Provided reassurance on [name], instructed to call if changes, bleeding problems. TSH,CBC, UA, Pap     Harrington ChallengerYOUNG,NANCY J Reeves Memorial Medical CenterWHNP, 10:25 AM 01/22/2016

## 2016-01-23 LAB — PAP IG W/ RFLX HPV ASCU

## 2016-03-19 DIAGNOSIS — F419 Anxiety disorder, unspecified: Secondary | ICD-10-CM | POA: Insufficient documentation

## 2016-03-19 DIAGNOSIS — F331 Major depressive disorder, recurrent, moderate: Secondary | ICD-10-CM | POA: Insufficient documentation

## 2016-05-28 ENCOUNTER — Telehealth: Payer: Self-pay | Admitting: *Deleted

## 2016-05-28 NOTE — Telephone Encounter (Signed)
Pt has Jill Pham IUD has noticed more cramping than usual, had placed in Sept 2017 at different office. Taking OTC aleve that helps some, pt will watch over weekend and call Monday for OV if cramping continues.

## 2016-06-10 ENCOUNTER — Ambulatory Visit (INDEPENDENT_AMBULATORY_CARE_PROVIDER_SITE_OTHER): Payer: Managed Care, Other (non HMO) | Admitting: Women's Health

## 2016-06-10 ENCOUNTER — Ambulatory Visit: Payer: Managed Care, Other (non HMO) | Admitting: Women's Health

## 2016-06-10 ENCOUNTER — Encounter: Payer: Self-pay | Admitting: Women's Health

## 2016-06-10 VITALS — BP 114/68

## 2016-06-10 DIAGNOSIS — Z113 Encounter for screening for infections with a predominantly sexual mode of transmission: Secondary | ICD-10-CM

## 2016-06-10 NOTE — Progress Notes (Signed)
Presents with request for STD screen. Long-term partner unfaithful. Currently having no symptoms of discharge, abdominal pain or fever. Was treated for UTI with Macrobid last week at work. Works as a LawyerCNA at [employer], hoping to start nursing school. 12/2015 [name] IUD. After  placed IUD strings not visible ultrasound confirms proper placement. Had a cycle in February, rare bleeding.  Exam: Appears well. External genitalia within normal limits, speculum exam no visible discharge, erythema, odor. Cervix without visible lesion, IUD strings not visible, GC/Chlamydia culture taken. Bimanual no CMT or adnexal tenderness.  STD screen  Plan: GC/Chlamydia culture pending, HIV, hep B, C, RPR. Encouraged condoms if sexually active.

## 2016-06-11 LAB — HEPATITIS C ANTIBODY: HCV Ab: NEGATIVE

## 2016-06-11 LAB — RPR

## 2016-06-11 LAB — GC/CHLAMYDIA PROBE AMP
CT Probe RNA: NOT DETECTED
GC Probe RNA: NOT DETECTED

## 2016-06-11 LAB — HEPATITIS B SURFACE ANTIGEN: Hepatitis B Surface Ag: NEGATIVE

## 2016-06-11 LAB — HIV ANTIBODY (ROUTINE TESTING W REFLEX): HIV 1&2 Ab, 4th Generation: NONREACTIVE

## 2016-08-19 ENCOUNTER — Encounter: Payer: Self-pay | Admitting: Gynecology

## 2016-08-28 ENCOUNTER — Ambulatory Visit (INDEPENDENT_AMBULATORY_CARE_PROVIDER_SITE_OTHER): Payer: Managed Care, Other (non HMO) | Admitting: Pediatrics

## 2016-08-28 ENCOUNTER — Encounter: Payer: Self-pay | Admitting: Pediatrics

## 2016-08-28 VITALS — BP 108/72 | HR 90 | Temp 97.5°F | Ht 61.0 in | Wt 137.6 lb

## 2016-08-28 DIAGNOSIS — G4700 Insomnia, unspecified: Secondary | ICD-10-CM | POA: Diagnosis not present

## 2016-08-28 DIAGNOSIS — F419 Anxiety disorder, unspecified: Secondary | ICD-10-CM | POA: Diagnosis not present

## 2016-08-28 MED ORDER — SERTRALINE HCL 50 MG PO TABS: 50.0000 mg | ORAL_TABLET | Freq: Every day | ORAL | 3 refills | Status: DC

## 2016-08-28 MED ORDER — ZOLPIDEM TARTRATE ER 6.25 MG PO TBCR: 6.2500 mg | EXTENDED_RELEASE_TABLET | ORAL | 1 refills | Status: AC | PRN

## 2016-08-28 NOTE — Progress Notes (Signed)
  Subjective:   Patient ID: Jill Pham, female    DOB: 10-27-1990, 26 y.o.   MRN: [id number] CC: New Patient (Initial Visit) anxiety HPI: Jill Pham is a 26 y.o. female presenting for New Patient (Initial Visit)  On lexapro 20mg  for 8 mo for anxiety, stopped  Anxiety bothering her the most now Feeling stressed all the time, with work, school, home Taking [place] as needed, hasnt taken one in over a month Constant worry about a lot of different things Feeling safe at home Has had depression in the past, mood has been fine 2 days a month or so may feel down, resolves No thoughts of self harm or not wanting to be here  She thinks her dad was medication for depression but isnt sure what  Past Medical History:  Diagnosis Date  . Anxiety   . Depression    Family History  Problem Relation Age of Onset  . Healthy Mother   . Healthy Father   . Heart disease Father   . Cancer Paternal Grandmother   . Heart disease Paternal Grandmother   . Heart disease Paternal Grandfather   PGM with breast cancer around 850  Social History   Social History  . Marital status: Single    Spouse name: N/A  . Number of children: N/A  . Years of education: N/A   Social History Main Topics  . Smoking status: Never Smoker  . Smokeless tobacco: Never Used  . Alcohol use No  . Drug use: No  . Sexual activity: Yes    Birth control/ protection: IUD   Other Topics Concern  . None   Social History Narrative  . None   ROS: All systems negative other than what is in HPI  Objective:    BP 108/72   Pulse 90   Temp 97.5 F (36.4 C) (Oral)   Ht 5\' 1"  (1.549 m)   Wt 137 lb 9.6 oz (62.4 kg)   BMI 26.00 kg/m   Wt Readings from Last 3 Encounters:  08/28/16 137 lb 9.6 oz (62.4 kg)  01/22/16 140 lb (63.5 kg)  02/20/14 133 lb (60.3 kg)    Gen: NAD, alert, cooperative with exam, NCAT EYES: EOMI, no conjunctival injection, or no icterus ENT:  TMs pearly gray b/l, OP without  erythema LYMPH: no cervical LAD CV: NRRR, normal S1/S2, no murmur, distal pulses 2+ b/l Resp: CTABL, no wheezes, normal WOB Abd: +BS, soft, NTND. no guarding or organomegaly Ext: No edema, warm Neuro: Alert and oriented MSK: normal muscle bulk  Assessment & Plan:  Anay was seen today for new patient (initial visit) and anxiety.  Diagnoses and all orders for this visit:  Anxiety Mood is fine, ongoing anxiety symptoms, not on medication now  start zoloft, 25mg  for 8 days then full dose -     sertraline (ZOLOFT) 50 MG tablet; Take 1 tablet (50 mg total) by mouth daily.  Insomnia, unspecified type Taking at most a couple times a week hasnt taken in over a month Takes nights before 12 hr shift Discussed sleep hygiene, minimizing ambien use Hopefully will be able to decrease, come off med once anxiety is treated -     zolpidem (AMBIEN CR) 6.25 MG CR tablet; Take 1 tablet (6.25 mg total) by mouth as needed.   Follow up plan: Return in about 2 months (around 10/28/2016). Rex Krasarol Vincent, MD [facility]

## 2016-09-16 ENCOUNTER — Encounter: Payer: Self-pay | Admitting: Pediatrics

## 2016-12-06 ENCOUNTER — Encounter: Payer: Self-pay | Admitting: Pediatrics

## 2017-02-24 ENCOUNTER — Encounter: Payer: Managed Care, Other (non HMO) | Admitting: Women's Health

## 2017-05-12 ENCOUNTER — Telehealth: Payer: Self-pay

## 2017-05-12 NOTE — Telephone Encounter (Signed)
Jill Pham called and talked with patient and scheduled her.

## 2017-05-12 NOTE — Telephone Encounter (Signed)
Patient called in voice mail stating she was concerned about her iUD. Spotting more than usual and sometimes pains.  The message cut off before she ended it. I asked CLaudia to call her and let her know I recommended office visit and go ahead and schedule her.

## 2017-05-25 ENCOUNTER — Ambulatory Visit: Payer: Managed Care, Other (non HMO) | Admitting: Women's Health

## 2017-06-09 ENCOUNTER — Encounter: Payer: Self-pay | Admitting: Women's Health

## 2017-06-09 ENCOUNTER — Ambulatory Visit (INDEPENDENT_AMBULATORY_CARE_PROVIDER_SITE_OTHER): Payer: Managed Care, Other (non HMO) | Admitting: Women's Health

## 2017-06-09 VITALS — BP 118/80 | Ht 61.0 in | Wt 149.0 lb

## 2017-06-09 DIAGNOSIS — Z01419 Encounter for gynecological examination (general) (routine) without abnormal findings: Secondary | ICD-10-CM | POA: Diagnosis not present

## 2017-06-09 DIAGNOSIS — Z13220 Encounter for screening for lipoid disorders: Secondary | ICD-10-CM

## 2017-06-09 DIAGNOSIS — N898 Other specified noninflammatory disorders of vagina: Secondary | ICD-10-CM

## 2017-06-09 LAB — WET PREP FOR TRICH, YEAST, CLUE

## 2017-06-09 MED ORDER — METRONIDAZOLE 0.75 % VA GEL: VAGINAL | 0 refills | Status: DC

## 2017-06-09 NOTE — Patient Instructions (Signed)
Health Maintenance, Female Adopting a healthy lifestyle and getting preventive care can go a long way to promote health and wellness. Talk with your health care provider about what schedule of regular examinations is right for you. This is a good chance for you to check in with your provider about disease prevention and staying healthy. In between checkups, there are plenty of things you can do on your own. Experts have done a lot of research about which lifestyle changes and preventive measures are most likely to keep you healthy. Ask your health care provider for more information. Weight and diet Eat a healthy diet  Be sure to include plenty of vegetables, fruits, low-fat dairy products, and lean protein.  Do not eat a lot of foods high in solid fats, added sugars, or salt.  Get regular exercise. This is one of the most important things you can do for your health. ? Most adults should exercise for at least 150 minutes each week. The exercise should increase your heart rate and make you sweat (moderate-intensity exercise). ? Most adults should also do strengthening exercises at least twice a week. This is in addition to the moderate-intensity exercise.  Maintain a healthy weight  Body mass index (BMI) is a measurement that can be used to identify possible weight problems. It estimates body fat based on height and weight. Your health care provider can help determine your BMI and help you achieve or maintain a healthy weight.  For females 20 years of age and older: ? A BMI below 18.5 is considered underweight. ? A BMI of 18.5 to 24.9 is normal. ? A BMI of 25 to 29.9 is considered overweight. ? A BMI of 30 and above is considered obese.  Watch levels of cholesterol and blood lipids  You should start having your blood tested for lipids and cholesterol at 26 years of age, then have this test every 5 years.  You may need to have your cholesterol levels checked more often if: ? Your lipid or  cholesterol levels are high. ? You are older than 26 years of age. ? You are at high risk for heart disease.  Cancer screening Lung Cancer  Lung cancer screening is recommended for adults 55-80 years old who are at high risk for lung cancer because of a history of smoking.  A yearly low-dose CT scan of the lungs is recommended for people who: ? Currently smoke. ? Have quit within the past 15 years. ? Have at least a 30-pack-year history of smoking. A pack year is smoking an average of one pack of cigarettes a day for 1 year.  Yearly screening should continue until it has been 15 years since you quit.  Yearly screening should stop if you develop a health problem that would prevent you from having lung cancer treatment.  Breast Cancer  Practice breast self-awareness. This means understanding how your breasts normally appear and feel.  It also means doing regular breast self-exams. Let your health care provider know about any changes, no matter how small.  If you are in your 20s or 30s, you should have a clinical breast exam (CBE) by a health care provider every 1-3 years as part of a regular health exam.  If you are 40 or older, have a CBE every year. Also consider having a breast X-ray (mammogram) every year.  If you have a family history of breast cancer, talk to your health care provider about genetic screening.  If you are at high risk   for breast cancer, talk to your health care provider about having an MRI and a mammogram every year.  Breast cancer gene (BRCA) assessment is recommended for women who have family members with BRCA-related cancers. BRCA-related cancers include: ? Breast. ? Ovarian. ? Tubal. ? Peritoneal cancers.  Results of the assessment will determine the need for genetic counseling and BRCA1 and BRCA2 testing.  Cervical Cancer Your health care provider may recommend that you be screened regularly for cancer of the pelvic organs (ovaries, uterus, and  vagina). This screening involves a pelvic examination, including checking for microscopic changes to the surface of your cervix (Pap test). You may be encouraged to have this screening done every 3 years, beginning at age 22.  For women ages 56-65, health care providers may recommend pelvic exams and Pap testing every 3 years, or they may recommend the Pap and pelvic exam, combined with testing for human papilloma virus (HPV), every 5 years. Some types of HPV increase your risk of cervical cancer. Testing for HPV may also be done on women of any age with unclear Pap test results.  Other health care providers may not recommend any screening for nonpregnant women who are considered low risk for pelvic cancer and who do not have symptoms. Ask your health care provider if a screening pelvic exam is right for you.  If you have had past treatment for cervical cancer or a condition that could lead to cancer, you need Pap tests and screening for cancer for at least 20 years after your treatment. If Pap tests have been discontinued, your risk factors (such as having a new sexual partner) need to be reassessed to determine if screening should resume. Some women have medical problems that increase the chance of getting cervical cancer. In these cases, your health care provider may recommend more frequent screening and Pap tests.  Colorectal Cancer  This type of cancer can be detected and often prevented.  Routine colorectal cancer screening usually begins at 26 years of age and continues through 26 years of age.  Your health care provider may recommend screening at an earlier age if you have risk factors for colon cancer.  Your health care provider may also recommend using home test kits to check for hidden blood in the stool.  A small camera at the end of a tube can be used to examine your colon directly (sigmoidoscopy or colonoscopy). This is done to check for the earliest forms of colorectal  cancer.  Routine screening usually begins at age 33.  Direct examination of the colon should be repeated every 5-10 years through 26 years of age. However, you may need to be screened more often if early forms of precancerous polyps or small growths are found.  Skin Cancer  Check your skin from head to toe regularly.  Tell your health care provider about any new moles or changes in moles, especially if there is a change in a mole's shape or color.  Also tell your health care provider if you have a mole that is larger than the size of a pencil eraser.  Always use sunscreen. Apply sunscreen liberally and repeatedly throughout the day.  Protect yourself by wearing long sleeves, pants, a wide-brimmed hat, and sunglasses whenever you are outside.  Heart disease, diabetes, and high blood pressure  High blood pressure causes heart disease and increases the risk of stroke. High blood pressure is more likely to develop in: ? People who have blood pressure in the high end of  the normal range (130-139/85-89 mm Hg). ? People who are overweight or obese. ? People who are African American.  If you are 21-29 years of age, have your blood pressure checked every 3-5 years. If you are 3 years of age or older, have your blood pressure checked every year. You should have your blood pressure measured twice-once when you are at a hospital or clinic, and once when you are not at a hospital or clinic. Record the average of the two measurements. To check your blood pressure when you are not at a hospital or clinic, you can use: ? An automated blood pressure machine at a pharmacy. ? A home blood pressure monitor.  If you are between 17 years and 37 years old, ask your health care provider if you should take aspirin to prevent strokes.  Have regular diabetes screenings. This involves taking a blood sample to check your fasting blood sugar level. ? If you are at a normal weight and have a low risk for diabetes,  have this test once every three years after 26 years of age. ? If you are overweight and have a high risk for diabetes, consider being tested at a younger age or more often. Preventing infection Hepatitis B  If you have a higher risk for hepatitis B, you should be screened for this virus. You are considered at high risk for hepatitis B if: ? You were born in a country where hepatitis B is common. Ask your health care provider which countries are considered high risk. ? Your parents were born in a high-risk country, and you have not been immunized against hepatitis B (hepatitis B vaccine). ? You have HIV or AIDS. ? You use needles to inject street drugs. ? You live with someone who has hepatitis B. ? You have had sex with someone who has hepatitis B. ? You get hemodialysis treatment. ? You take certain medicines for conditions, including cancer, organ transplantation, and autoimmune conditions.  Hepatitis C  Blood testing is recommended for: ? Everyone born from [birth year] through 1964. ? Anyone with known risk factors for hepatitis C.  Sexually transmitted infections (STIs)  You should be screened for sexually transmitted infections (STIs) including gonorrhea and chlamydia if: ? You are sexually active and are younger than 26 years of age. ? You are older than 26 years of age and your health care provider tells you that you are at risk for this type of infection. ? Your sexual activity has changed since you were last screened and you are at an increased risk for chlamydia or gonorrhea. Ask your health care provider if you are at risk.  If you do not have HIV, but are at risk, it may be recommended that you take a prescription medicine daily to prevent HIV infection. This is called pre-exposure prophylaxis (PrEP). You are considered at risk if: ? You are sexually active and do not regularly use condoms or know the HIV status of your partner(s). ? You take drugs by injection. ? You are  sexually active with a partner who has HIV.  Talk with your health care provider about whether you are at high risk of being infected with HIV. If you choose to begin PrEP, you should first be tested for HIV. You should then be tested every 3 months for as long as you are taking PrEP. Pregnancy  If you are premenopausal and you may become pregnant, ask your health care provider about preconception counseling.  If you may become  pregnant, take 400 to 800 micrograms (mcg) of folic acid every day.  If you want to prevent pregnancy, talk to your health care provider about birth control (contraception). Osteoporosis and menopause  Osteoporosis is a disease in which the bones lose minerals and strength with aging. This can result in serious bone fractures. Your risk for osteoporosis can be identified using a bone density scan.  If you are 65 years of age or older, or if you are at risk for osteoporosis and fractures, ask your health care provider if you should be screened.  Ask your health care provider whether you should take a calcium or vitamin D supplement to lower your risk for osteoporosis.  Menopause may have certain physical symptoms and risks.  Hormone replacement therapy may reduce some of these symptoms and risks. Talk to your health care provider about whether hormone replacement therapy is right for you. Follow these instructions at home:  Schedule regular health, dental, and eye exams.  Stay current with your immunizations.  Do not use any tobacco products including cigarettes, chewing tobacco, or electronic cigarettes.  If you are pregnant, do not drink alcohol.  If you are breastfeeding, limit how much and how often you drink alcohol.  Limit alcohol intake to no more than 1 drink per day for nonpregnant women. One drink equals 12 ounces of beer, 5 ounces of wine, or 1 ounces of hard liquor.  Do not use street drugs.  Do not share needles.  Ask your health care  provider for help if you need support or information about quitting drugs.  Tell your health care provider if you often feel depressed.  Tell your health care provider if you have ever been abused or do not feel safe at home. This information is not intended to replace advice given to you by your health care provider. Make sure you discuss any questions you have with your health care provider. Document Released: 10/06/2010 Document Revised: 08/29/2015 Document Reviewed: 12/25/2014 Elsevier Interactive Patient Education  2018 [employer] Carbohydrate Counting for Diabetes Mellitus, Adult Carbohydrate counting is a method for keeping track of how many carbohydrates you eat. Eating carbohydrates naturally increases the amount of sugar (glucose) in the blood. Counting how many carbohydrates you eat helps keep your blood glucose within normal limits, which helps you manage your diabetes (diabetes mellitus). It is important to know how many carbohydrates you can safely have in each meal. This is different for every person. A diet and nutrition specialist (registered dietitian) can help you make a meal plan and calculate how many carbohydrates you should have at each meal and snack. Carbohydrates are found in the following foods:  Grains, such as breads and cereals.  Dried beans and soy products.  Starchy vegetables, such as potatoes, peas, and corn.  Fruit and fruit juices.  Milk and yogurt.  Sweets and snack foods, such as cake, cookies, candy, chips, and soft drinks.  How do I count carbohydrates? There are two ways to count carbohydrates in food. You can use either of the methods or a combination of both. Reading "Nutrition Facts" on packaged food The "Nutrition Facts" list is included on the labels of almost all packaged foods and beverages in the [place] It includes:  The serving size.  Information about nutrients in each serving, including the grams (g) of carbohydrate per  serving.  To use the "Nutrition Facts":  Decide how many servings you will have.  Multiply the number of servings by the number of carbohydrates   per serving.  The resulting number is the total amount of carbohydrates that you will be having.  Learning standard serving sizes of other foods When you eat foods containing carbohydrates that are not packaged or do not include "Nutrition Facts" on the label, you need to measure the servings in order to count the amount of carbohydrates:  Measure the foods that you will eat with a food scale or measuring cup, if needed.  Decide how many standard-size servings you will eat.  Multiply the number of servings by 15. Most carbohydrate-rich foods have about 15 g of carbohydrates per serving. ? For example, if you eat 8 oz (170 g) of strawberries, you will have eaten 2 servings and 30 g of carbohydrates (2 servings x 15 g = 30 g).  For foods that have more than one food mixed, such as soups and casseroles, you must count the carbohydrates in each food that is included.  The following list contains standard serving sizes of common carbohydrate-rich foods. Each of these servings has about 15 g of carbohydrates:   hamburger bun or  English muffin.   oz (15 mL) syrup.   oz (14 g) jelly.  1 slice of bread.  1 six-inch tortilla.  3 oz (85 g) cooked rice or pasta.  4 oz (113 g) cooked dried beans.  4 oz (113 g) starchy vegetable, such as peas, corn, or potatoes.  4 oz (113 g) hot cereal.  4 oz (113 g) mashed potatoes or  of a large baked potato.  4 oz (113 g) canned or frozen fruit.  4 oz (120 mL) fruit juice.  4-6 crackers.  6 chicken nuggets.  6 oz (170 g) unsweetened dry cereal.  6 oz (170 g) plain fat-free yogurt or yogurt sweetened with artificial sweeteners.  8 oz (240 mL) milk.  8 oz (170 g) fresh fruit or one small piece of fruit.  24 oz (680 g) popped popcorn.  Example of carbohydrate counting Sample meal  3  oz (85 g) chicken breast.  6 oz (170 g) brown rice.  4 oz (113 g) corn.  8 oz (240 mL) milk.  8 oz (170 g) strawberries with sugar-free whipped topping. Carbohydrate calculation 1. Identify the foods that contain carbohydrates: ? Rice. ? Corn. ? Milk. ? Strawberries. 2. Calculate how many servings you have of each food: ? 2 servings rice. ? 1 serving corn. ? 1 serving milk. ? 1 serving strawberries. 3. Multiply each number of servings by 15 g: ? 2 servings rice x 15 g = 30 g. ? 1 serving corn x 15 g = 15 g. ? 1 serving milk x 15 g = 15 g. ? 1 serving strawberries x 15 g = 15 g. 4. Add together all of the amounts to find the total grams of carbohydrates eaten: ? 30 g + 15 g + 15 g + 15 g = 75 g of carbohydrates total. This information is not intended to replace advice given to you by your health care provider. Make sure you discuss any questions you have with your health care provider. Document Released: 03/23/2005 Document Revised: 10/11/2015 Document Reviewed: 09/04/2015 Elsevier Interactive Patient Education  [employer].

## 2017-06-09 NOTE — Progress Notes (Signed)
[id number]

## 2017-06-09 NOTE — Progress Notes (Signed)
Jill Pham [birth year] [id number]    History:    Presents for annual exam.  [facility]  with light cycle most months. Strings not seen at follow-up visit ultrasound confirmed proper placement. 02/2015 LGSIL with normal Paps after. Gardasil series completed. Negative STD screen with current partner. Complained of weight gain despite exercise and diet. Anxiety and depression stable on Zoloft per primary care.   Past medical history, past surgical history, family history and social history were all reviewed and documented in the EPIC chart. 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 Father history of MI and stroke.  ROS:  A ROS was performed and pertinent positives and negatives are included.  Exam:  Vitals:   06/09/17 1505  BP: 118/80  Weight: 149 lb (67.6 kg)  Height: 5\' 1"  (1.549 m)   Body mass index is 28.15 kg/m.   General appearance:  Normal Thyroid:  Symmetrical, normal in size, without palpable masses or nodularity. Respiratory  Auscultation:  Clear without wheezing or rhonchi Cardiovascular  Auscultation:  Regular rate, without rubs, murmurs or gallops  Edema/varicosities:  Not grossly evident Abdominal  Soft,nontender, without masses, guarding or rebound.  Liver/spleen:  No organomegaly noted  Hernia:  None appreciated  Skin  Inspection:  Grossly normal   Breasts: Examined lying and sitting.     Right: Without masses, retractions, discharge or axillary adenopathy.     Left: Without masses, retractions, discharge or axillary adenopathy. Gentitourinary   Inguinal/mons:  Normal without inguinal adenopathy  External genitalia:  Normal  BUS/Urethra/Skene's glands:  Normal  Vagina:  Normal  Cervix:  Normal strings not visible  Uterus:  normal in size, shape and contour.  Midline and mobile  Adnexa/parametria:     Rt: Without masses or tenderness.   Lt: Without masses or tenderness.  Anus and perineum: Normal   Assessment/Plan:  27  y.o. S WF G0 for annual exam with no complaints other than difficulty losing weight.  12/2015 [name] light cycle  Bacteria vaginosis 2016 LGSIL, normal Paps after Anxiety /depression stable on Zoloft-primary care manages Insomnia-primary care manages  Plan: MetroGel vaginal cream 1 applicator at bedtime 5, alcohol precautions reviewed, instructed to call if no relief of odor. SBE's, continue regular exercise, healthy diet, calcium rich diet, MVI daily encouraged. CBC, glucose, lipid panel, Pap.   Harrington Challengerancy J [facility]WHNP, 4:45 PM 06/09/2017

## 2017-06-11 ENCOUNTER — Encounter: Payer: Self-pay | Admitting: Women's Health

## 2017-06-11 LAB — TEST AUTHORIZATION

## 2017-06-11 LAB — URINALYSIS, COMPLETE W/RFL CULTURE
BACTERIA UA: NONE SEEN /HPF
Bilirubin Urine: NEGATIVE
Glucose, UA: NEGATIVE
Hgb urine dipstick: NEGATIVE
Hyaline Cast: NONE SEEN /LPF
NITRITES URINE, INITIAL: NEGATIVE
Protein, ur: NEGATIVE
RBC / HPF: NONE SEEN /HPF (ref 0–2)
SPECIFIC GRAVITY, URINE: 1.022 (ref 1.001–1.03)
pH: 6 (ref 5.0–8.0)

## 2017-06-11 LAB — CBC WITH DIFFERENTIAL/PLATELET
Basophils Absolute: 51 cells/uL (ref 0–200)
Basophils Relative: 0.6 %
EOS PCT: 1.9 %
Eosinophils Absolute: 162 cells/uL (ref 15–500)
HEMATOCRIT: 38.5 % (ref 35.0–45.0)
HEMOGLOBIN: 13.6 g/dL (ref 11.7–15.5)
LYMPHS ABS: 2287 {cells}/uL (ref 850–3900)
MCH: 30.7 pg (ref 27.0–33.0)
MCHC: 35.3 g/dL (ref 32.0–36.0)
MCV: 86.9 fL (ref 80.0–100.0)
MPV: 10.5 fL (ref 7.5–12.5)
Monocytes Relative: 6.7 %
NEUTROS ABS: 5432 {cells}/uL (ref 1500–7800)
Neutrophils Relative %: 63.9 %
Platelets: 259 10*3/uL (ref 140–400)
RBC: 4.43 10*6/uL (ref 3.80–5.10)
RDW: 11.8 % (ref 11.0–15.0)
Total Lymphocyte: 26.9 %
WBC: 8.5 10*3/uL (ref 3.8–10.8)
WBCMIX: 570 {cells}/uL (ref 200–950)

## 2017-06-11 LAB — PAP IG W/ RFLX HPV ASCU

## 2017-06-11 LAB — URINE CULTURE
MICRO NUMBER:: 90288046
Result:: NO GROWTH
SPECIMEN QUALITY: ADEQUATE

## 2017-06-11 LAB — HEMOGLOBIN A1C
Hgb A1c MFr Bld: 4.7 % of total Hgb (ref <5.7)
MEAN PLASMA GLUCOSE: 88 (calc)
eAG (mmol/L): 4.9 (calc)

## 2017-06-11 LAB — GLUCOSE, RANDOM: Glucose, Bld: 131 mg/dL — ABNORMAL HIGH (ref 65–99)

## 2017-06-11 LAB — CULTURE INDICATED

## 2017-06-11 LAB — LIPID PANEL
CHOL/HDL RATIO: 2.8 (calc) (ref <5.0)
CHOLESTEROL: 162 mg/dL (ref <200)
HDL: 57 mg/dL (ref >50)
LDL CHOLESTEROL (CALC): 85 mg/dL
Non-HDL Cholesterol (Calc): 105 mg/dL (calc) (ref <130)
Triglycerides: 102 mg/dL (ref <150)

## 2017-09-15 ENCOUNTER — Ambulatory Visit: Payer: Managed Care, Other (non HMO) | Admitting: Women's Health

## 2018-09-16 ENCOUNTER — Encounter: Payer: Self-pay | Admitting: Gynecology

## 2018-09-16 ENCOUNTER — Ambulatory Visit (INDEPENDENT_AMBULATORY_CARE_PROVIDER_SITE_OTHER): Payer: PRIVATE HEALTH INSURANCE | Admitting: Gynecology

## 2018-09-16 ENCOUNTER — Other Ambulatory Visit: Payer: Self-pay

## 2018-09-16 DIAGNOSIS — N898 Other specified noninflammatory disorders of vagina: Secondary | ICD-10-CM | POA: Diagnosis not present

## 2018-09-16 MED ORDER — METRONIDAZOLE 0.75 % VA GEL: 1.0000 | Freq: Every day | VAGINAL | 0 refills | Status: DC

## 2018-09-16 NOTE — Patient Instructions (Signed)
Use the vaginal cream nightly for 5 nights.  Follow-up if your symptoms persist, worsen or recur

## 2018-09-16 NOTE — Progress Notes (Signed)
    Jill Pham November 20, 1990 [id number]        28 y.o. patient of Nancy's who calls for telemedicine encounter.  She was identified by 2 identifiers.  She is at work and I am in the office.  She understands the limitations of a telemedicine encounter.  She is complaining of 2 days of vaginal odor.  History of bacterial vaginosis in the past.  No discharge or irritation/itching.  Wanted to catch it early before this develops.  No urinary symptoms such as frequency dysuria urgency low back pain fever or chills.  No nausea vomiting diarrhea constipation.  Past medical history,surgical history, problem list, medications, allergies, family history and social history were all reviewed and documented in the EPIC chart.  Directed ROS with pertinent positives and negatives documented in the history of present illness/assessment and plan.   Assessment/Plan:  28 y.o. with vaginal odor.  Does have a history of bacterial vaginosis in the past.  No other symptoms.  Options for management reviewed and ultimately she prefers vaginal cream treatment.  Had used MetroGel in the past without difficulty.  MetroGel 1 applicator at bedtime x5 nights prescribed.  Follow-up if symptoms persist, worsen or recur.  11 minutes of my time was spent in direct communication with the patient and review of her records and history.    Anastasio Auerbach MD, 10:52 AM 09/16/2018

## 2018-10-06 ENCOUNTER — Other Ambulatory Visit: Payer: Self-pay

## 2018-10-10 ENCOUNTER — Other Ambulatory Visit: Payer: Self-pay

## 2018-10-10 ENCOUNTER — Ambulatory Visit (INDEPENDENT_AMBULATORY_CARE_PROVIDER_SITE_OTHER): Payer: PRIVATE HEALTH INSURANCE | Admitting: Women's Health

## 2018-10-10 ENCOUNTER — Encounter: Payer: Self-pay | Admitting: Women's Health

## 2018-10-10 VITALS — BP 118/80 | Ht 61.0 in | Wt 154.0 lb

## 2018-10-10 DIAGNOSIS — Z01419 Encounter for gynecological examination (general) (routine) without abnormal findings: Secondary | ICD-10-CM | POA: Diagnosis not present

## 2018-10-10 DIAGNOSIS — Z30432 Encounter for removal of intrauterine contraceptive device: Secondary | ICD-10-CM

## 2018-10-10 MED ORDER — ETONOGESTREL-ETHINYL ESTRADIOL 0.12-0.015 MG/24HR VA RING: 1.0000 | VAGINAL_RING | VAGINAL | 4 refills | Status: AC

## 2018-10-10 NOTE — Patient Instructions (Signed)
Health Maintenance, Female Adopting a healthy lifestyle and getting preventive care are important in promoting health and wellness. Ask your health care provider about:  The right schedule for you to have regular tests and exams.  Things you can do on your own to prevent diseases and keep yourself healthy. What should I know about diet, weight, and exercise? Eat a healthy diet   Eat a diet that includes plenty of vegetables, fruits, low-fat dairy products, and lean protein.  Do not eat a lot of foods that are high in solid fats, added sugars, or sodium. Maintain a healthy weight Body mass index (BMI) is used to identify weight problems. It estimates body fat based on height and weight. Your health care provider can help determine your BMI and help you achieve or maintain a healthy weight. Get regular exercise Get regular exercise. This is one of the most important things you can do for your health. Most adults should:  Exercise for at least 150 minutes each week. The exercise should increase your heart rate and make you sweat (moderate-intensity exercise).  Do strengthening exercises at least twice a week. This is in addition to the moderate-intensity exercise.  Spend less time sitting. Even light physical activity can be beneficial. Watch cholesterol and blood lipids Have your blood tested for lipids and cholesterol at 27 years of age, then have this test every 5 years. Have your cholesterol levels checked more often if:  Your lipid or cholesterol levels are high.  You are older than 27 years of age.  You are at high risk for heart disease. What should I know about cancer screening? Depending on your health history and family history, you may need to have cancer screening at various ages. This may include screening for:  Breast cancer.  Cervical cancer.  Colorectal cancer.  Skin cancer.  Lung cancer. What should I know about heart disease, diabetes, and high blood  pressure? Blood pressure and heart disease  High blood pressure causes heart disease and increases the risk of stroke. This is more likely to develop in people who have high blood pressure readings, are of African descent, or are overweight.  Have your blood pressure checked: ? Every 3-5 years if you are 18-39 years of age. ? Every year if you are 40 years old or older. Diabetes Have regular diabetes screenings. This checks your fasting blood sugar level. Have the screening done:  Once every three years after age 40 if you are at a normal weight and have a low risk for diabetes.  More often and at a younger age if you are overweight or have a high risk for diabetes. What should I know about preventing infection? Hepatitis B If you have a higher risk for hepatitis B, you should be screened for this virus. Talk with your health care provider to find out if you are at risk for hepatitis B infection. Hepatitis C Testing is recommended for:  Everyone born from 1945 through 1965.  Anyone with known risk factors for hepatitis C. Sexually transmitted infections (STIs)  Get screened for STIs, including gonorrhea and chlamydia, if: ? You are sexually active and are younger than 27 years of age. ? You are older than 27 years of age and your health care provider tells you that you are at risk for this type of infection. ? Your sexual activity has changed since you were last screened, and you are at increased risk for chlamydia or gonorrhea. Ask your health care provider if   you are at risk.  Ask your health care provider about whether you are at high risk for HIV. Your health care provider may recommend a prescription medicine to help prevent HIV infection. If you choose to take medicine to prevent HIV, you should first get tested for HIV. You should then be tested every 3 months for as long as you are taking the medicine. Pregnancy  If you are about to stop having your period (premenopausal) and  you may become pregnant, seek counseling before you get pregnant.  Take 400 to 800 micrograms (mcg) of folic acid every day if you become pregnant.  Ask for birth control (contraception) if you want to prevent pregnancy. Osteoporosis and menopause Osteoporosis is a disease in which the bones lose minerals and strength with aging. This can result in bone fractures. If you are 65 years old or older, or if you are at risk for osteoporosis and fractures, ask your health care provider if you should:  Be screened for bone loss.  Take a calcium or vitamin D supplement to lower your risk of fractures.  Be given hormone replacement therapy (HRT) to treat symptoms of menopause. Follow these instructions at home: Lifestyle  Do not use any products that contain nicotine or tobacco, such as cigarettes, e-cigarettes, and chewing tobacco. If you need help quitting, ask your health care provider.  Do not use street drugs.  Do not share needles.  Ask your health care provider for help if you need support or information about quitting drugs. Alcohol use  Do not drink alcohol if: ? Your health care provider tells you not to drink. ? You are pregnant, may be pregnant, or are planning to become pregnant.  If you drink alcohol: ? Limit how much you use to 0-1 drink a day. ? Limit intake if you are breastfeeding.  Be aware of how much alcohol is in your drink. In the [place] one drink equals one 12 oz bottle of beer (355 mL), one 5 oz glass of wine (148 mL), or one 1 oz glass of hard liquor (44 mL). General instructions  Schedule regular health, dental, and eye exams.  Stay current with your vaccines.  Tell your health care provider if: ? You often feel depressed. ? You have ever been abused or do not feel safe at home. Summary  Adopting a healthy lifestyle and getting preventive care are important in promoting health and wellness.  Follow your health care provider's instructions about healthy  diet, exercising, and getting tested or screened for diseases.  Follow your health care provider's instructions on monitoring your cholesterol and blood pressure. This information is not intended to replace advice given to you by your health care provider. Make sure you discuss any questions you have with your health care provider. Document Released: 10/06/2010 Document Revised: 03/16/2018 Document Reviewed: 03/16/2018 Elsevier Patient Education  2020 [employer]  

## 2018-10-10 NOTE — Progress Notes (Signed)
Jill Pham 22-Apr-1990 [id number]    History:    Presents for annual exam and requesting [name] removal. 12/2015 [name] with light cycles almost every month. Negative STD screen with current partner. 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 Gardasil completed. Denies vaginal discharge, burning, itching, odor, urinary sx's, diarrhea, or constipation.  Medical history includes anxiety/depression, insomnia- managed by PCP.  Past medical history, past surgical history, family history and social history were all reviewed and documented in the EPIC chart. Works as Quarry manager at [employer]. Now in [employer]. Father has history of stroke and heart attack.   ROS:  A ROS was performed and pertinent positives and negatives are included.  Exam:  Vitals:   10/10/18 1413  BP: 118/80  Weight: 154 lb (69.9 kg)  Height: 5\' 1"  (1.549 m)   Body mass index is 29.1 kg/m.   General appearance:  Normal Thyroid:  Symmetrical, normal in size, without palpable masses or nodularity. Respiratory  Auscultation:  Clear without wheezing or rhonchi Cardiovascular  Auscultation:  Regular rate, without rubs, murmurs or gallops  Edema/varicosities:  Not grossly evident Abdominal  Soft,nontender, without masses, guarding or rebound.  Liver/spleen:  No organomegaly noted  Hernia:  None appreciated  Skin  Inspection:  Grossly normal   Breasts: Examined lying and sitting.     Right: Without masses, retractions, discharge or axillary adenopathy.     Left: Without masses, retractions, discharge or axillary adenopathy. Gentitourinary   Inguinal/mons:  Normal without inguinal adenopathy  External genitalia:  Normal  BUS/Urethra/Skene's glands:  Normal  Vagina:  Normal  Cervix:  Normal, Strings not visible, IUD retrieved intact,  shown to patient, and discarded.   Uterus:  normal in size, shape and contour.  Midline and mobile  Adnexa/parametria:     Rt: Without masses or tenderness.   Lt: Without masses or  tenderness.  Anus and perineum: Normal  Assessment/Plan:  28 y.o.  SWF G0 for annual exam and removal of [name].  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 Patient elected for Nuvaring. Nuvaring 0.12-0.015 Mg 1 vaginally every 21 days. Reviewed proper use, condoms first month, not contraceptive.  Encouraged SBE's, exercise, healthy eating habits, decrease calorie/carbs, and multi-vitamin use.  Encouraged CBC, Glucose. Pap.    Huel Cote [facility], 2:52 PM 10/10/2018

## 2018-10-11 ENCOUNTER — Encounter: Payer: Self-pay | Admitting: Women's Health

## 2018-10-11 LAB — CBC WITH DIFFERENTIAL/PLATELET
Absolute Monocytes: 645 cells/uL (ref 200–950)
Basophils Absolute: 43 cells/uL (ref 0–200)
Basophils Relative: 0.5 %
Eosinophils Absolute: 198 cells/uL (ref 15–500)
Eosinophils Relative: 2.3 %
HCT: 40.7 % (ref 35.0–45.0)
Hemoglobin: 13.6 g/dL (ref 11.7–15.5)
Lymphs Abs: 2245 cells/uL (ref 850–3900)
MCH: 30 pg (ref 27.0–33.0)
MCHC: 33.4 g/dL (ref 32.0–36.0)
MCV: 89.8 fL (ref 80.0–100.0)
MPV: 10.2 fL (ref 7.5–12.5)
Monocytes Relative: 7.5 %
Neutro Abs: 5470 cells/uL (ref 1500–7800)
Neutrophils Relative %: 63.6 %
Platelets: 300 10*3/uL (ref 140–400)
RBC: 4.53 10*6/uL (ref 3.80–5.10)
RDW: 11.9 % (ref 11.0–15.0)
Total Lymphocyte: 26.1 %
WBC: 8.6 10*3/uL (ref 3.8–10.8)

## 2018-10-11 LAB — PAP IG W/ RFLX HPV ASCU

## 2018-10-11 LAB — HEMOGLOBIN A1C
Hgb A1c MFr Bld: 4.8 % of total Hgb (ref <5.7)
Mean Plasma Glucose: 91 (calc)
eAG (mmol/L): 5 (calc)

## 2018-10-12 ENCOUNTER — Other Ambulatory Visit: Payer: Self-pay | Admitting: Women's Health

## 2018-10-12 MED ORDER — METRONIDAZOLE 500 MG PO TABS: 500.0000 mg | ORAL_TABLET | Freq: Two times a day (BID) | ORAL | 0 refills | Status: AC

## 2018-11-17 ENCOUNTER — Telehealth: Payer: Self-pay | Admitting: *Deleted

## 2018-11-17 NOTE — Telephone Encounter (Signed)
Patient called c/o bleeding since last Monday using nuvaring now, had IUD removed in July. Wearing super tampon and changing at least every 2 hours passing small clots. She asked if this normal after having IUD removed? Please advise

## 2018-11-17 NOTE — Telephone Encounter (Signed)
Message left

## 2018-11-18 ENCOUNTER — Other Ambulatory Visit: Payer: Self-pay | Admitting: Women's Health

## 2018-11-18 MED ORDER — MEGESTROL ACETATE 20 MG PO TABS: ORAL_TABLET | ORAL | 0 refills | Status: AC

## 2018-11-18 NOTE — Telephone Encounter (Signed)
Message result

## 2018-11-18 NOTE — Telephone Encounter (Signed)
Telephone call, states put NuvaRing in day [facility] IUD removed, less than 25 days and removed NuvaRing.  Had 2 days of light bleeding and then 1 week ago started heavy bleeding changing super plus tampon every 1-2 hours, passing clots and questions the cause.  Did use condoms first month on NuvaRing.  Megace 20 mg twice daily until bleeding stops reviewed will watch at this time since first cycle since removing [place],

## 2018-12-26 ENCOUNTER — Encounter: Payer: Self-pay | Admitting: Gynecology

## 2019-10-05 DIAGNOSIS — Z3A39 39 weeks gestation of pregnancy: Secondary | ICD-10-CM | POA: Diagnosis not present

## 2019-10-05 DIAGNOSIS — Z20822 Contact with and (suspected) exposure to covid-19: Secondary | ICD-10-CM | POA: Diagnosis not present

## 2019-10-05 DIAGNOSIS — Z3403 Encounter for supervision of normal first pregnancy, third trimester: Secondary | ICD-10-CM | POA: Diagnosis not present

## 2019-10-05 DIAGNOSIS — Z419 Encounter for procedure for purposes other than remedying health state, unspecified: Secondary | ICD-10-CM | POA: Diagnosis not present

## 2019-10-05 DIAGNOSIS — O99214 Obesity complicating childbirth: Secondary | ICD-10-CM | POA: Diagnosis not present

## 2019-10-05 DIAGNOSIS — O99344 Other mental disorders complicating childbirth: Secondary | ICD-10-CM | POA: Diagnosis not present

## 2019-10-05 DIAGNOSIS — Z79899 Other long term (current) drug therapy: Secondary | ICD-10-CM | POA: Diagnosis not present

## 2019-10-05 DIAGNOSIS — F331 Major depressive disorder, recurrent, moderate: Secondary | ICD-10-CM | POA: Diagnosis not present

## 2019-10-05 DIAGNOSIS — F419 Anxiety disorder, unspecified: Secondary | ICD-10-CM | POA: Diagnosis not present

## 2019-10-05 DIAGNOSIS — E669 Obesity, unspecified: Secondary | ICD-10-CM | POA: Diagnosis not present

## 2019-10-06 DIAGNOSIS — Z3A39 39 weeks gestation of pregnancy: Secondary | ICD-10-CM | POA: Diagnosis not present

## 2019-11-02 DIAGNOSIS — T8149XA Infection following a procedure, other surgical site, initial encounter: Secondary | ICD-10-CM | POA: Diagnosis not present

## 2019-11-05 DIAGNOSIS — Z419 Encounter for procedure for purposes other than remedying health state, unspecified: Secondary | ICD-10-CM | POA: Diagnosis not present

## 2019-12-06 DIAGNOSIS — Z419 Encounter for procedure for purposes other than remedying health state, unspecified: Secondary | ICD-10-CM | POA: Diagnosis not present

## 2019-12-14 DIAGNOSIS — Z30430 Encounter for insertion of intrauterine contraceptive device: Secondary | ICD-10-CM | POA: Diagnosis not present

## 2020-01-05 DIAGNOSIS — Z419 Encounter for procedure for purposes other than remedying health state, unspecified: Secondary | ICD-10-CM | POA: Diagnosis not present

## 2020-02-05 DIAGNOSIS — Z419 Encounter for procedure for purposes other than remedying health state, unspecified: Secondary | ICD-10-CM | POA: Diagnosis not present

## 2020-03-06 DIAGNOSIS — Z419 Encounter for procedure for purposes other than remedying health state, unspecified: Secondary | ICD-10-CM | POA: Diagnosis not present

## 2020-03-21 DIAGNOSIS — Z20822 Contact with and (suspected) exposure to covid-19: Secondary | ICD-10-CM | POA: Diagnosis not present

## 2020-04-06 DIAGNOSIS — Z419 Encounter for procedure for purposes other than remedying health state, unspecified: Secondary | ICD-10-CM | POA: Diagnosis not present

## 2020-04-18 DIAGNOSIS — O906 Postpartum mood disturbance: Secondary | ICD-10-CM | POA: Diagnosis not present

## 2020-05-07 DIAGNOSIS — Z419 Encounter for procedure for purposes other than remedying health state, unspecified: Secondary | ICD-10-CM | POA: Diagnosis not present

## 2020-06-04 DIAGNOSIS — Z419 Encounter for procedure for purposes other than remedying health state, unspecified: Secondary | ICD-10-CM | POA: Diagnosis not present

## 2020-07-05 DIAGNOSIS — Z419 Encounter for procedure for purposes other than remedying health state, unspecified: Secondary | ICD-10-CM | POA: Diagnosis not present

## 2020-08-04 DIAGNOSIS — Z419 Encounter for procedure for purposes other than remedying health state, unspecified: Secondary | ICD-10-CM | POA: Diagnosis not present

## 2020-09-04 DIAGNOSIS — Z419 Encounter for procedure for purposes other than remedying health state, unspecified: Secondary | ICD-10-CM | POA: Diagnosis not present

## 2020-10-04 DIAGNOSIS — Z419 Encounter for procedure for purposes other than remedying health state, unspecified: Secondary | ICD-10-CM | POA: Diagnosis not present

## 2020-11-04 DIAGNOSIS — Z419 Encounter for procedure for purposes other than remedying health state, unspecified: Secondary | ICD-10-CM | POA: Diagnosis not present

## 2020-12-05 DIAGNOSIS — Z419 Encounter for procedure for purposes other than remedying health state, unspecified: Secondary | ICD-10-CM | POA: Diagnosis not present

## 2020-12-25 DIAGNOSIS — F411 Generalized anxiety disorder: Secondary | ICD-10-CM | POA: Diagnosis not present

## 2021-01-04 DIAGNOSIS — Z419 Encounter for procedure for purposes other than remedying health state, unspecified: Secondary | ICD-10-CM | POA: Diagnosis not present

## 2021-01-20 DIAGNOSIS — F411 Generalized anxiety disorder: Secondary | ICD-10-CM | POA: Diagnosis not present

## 2021-02-04 DIAGNOSIS — Z419 Encounter for procedure for purposes other than remedying health state, unspecified: Secondary | ICD-10-CM | POA: Diagnosis not present

## 2021-03-06 DIAGNOSIS — Z419 Encounter for procedure for purposes other than remedying health state, unspecified: Secondary | ICD-10-CM | POA: Diagnosis not present

## 2021-04-06 DIAGNOSIS — Z419 Encounter for procedure for purposes other than remedying health state, unspecified: Secondary | ICD-10-CM | POA: Diagnosis not present

## 2021-05-07 DIAGNOSIS — Z419 Encounter for procedure for purposes other than remedying health state, unspecified: Secondary | ICD-10-CM | POA: Diagnosis not present

## 2021-06-04 DIAGNOSIS — Z419 Encounter for procedure for purposes other than remedying health state, unspecified: Secondary | ICD-10-CM | POA: Diagnosis not present

## 2021-07-05 DIAGNOSIS — Z419 Encounter for procedure for purposes other than remedying health state, unspecified: Secondary | ICD-10-CM | POA: Diagnosis not present

## 2021-07-22 DIAGNOSIS — R5383 Other fatigue: Secondary | ICD-10-CM | POA: Diagnosis not present

## 2021-07-22 DIAGNOSIS — F411 Generalized anxiety disorder: Secondary | ICD-10-CM | POA: Diagnosis not present

## 2021-08-04 DIAGNOSIS — Z419 Encounter for procedure for purposes other than remedying health state, unspecified: Secondary | ICD-10-CM | POA: Diagnosis not present

## 2021-09-04 DIAGNOSIS — Z419 Encounter for procedure for purposes other than remedying health state, unspecified: Secondary | ICD-10-CM | POA: Diagnosis not present

## 2021-10-04 DIAGNOSIS — Z419 Encounter for procedure for purposes other than remedying health state, unspecified: Secondary | ICD-10-CM | POA: Diagnosis not present

## 2021-11-04 DIAGNOSIS — Z419 Encounter for procedure for purposes other than remedying health state, unspecified: Secondary | ICD-10-CM | POA: Diagnosis not present

## 2021-12-05 DIAGNOSIS — Z419 Encounter for procedure for purposes other than remedying health state, unspecified: Secondary | ICD-10-CM | POA: Diagnosis not present

## 2022-01-04 DIAGNOSIS — Z419 Encounter for procedure for purposes other than remedying health state, unspecified: Secondary | ICD-10-CM | POA: Diagnosis not present

## 2022-02-04 DIAGNOSIS — Z419 Encounter for procedure for purposes other than remedying health state, unspecified: Secondary | ICD-10-CM | POA: Diagnosis not present

## 2022-03-06 DIAGNOSIS — Z419 Encounter for procedure for purposes other than remedying health state, unspecified: Secondary | ICD-10-CM | POA: Diagnosis not present

## 2022-04-06 DIAGNOSIS — Z419 Encounter for procedure for purposes other than remedying health state, unspecified: Secondary | ICD-10-CM | POA: Diagnosis not present

## 2022-05-07 DIAGNOSIS — Z419 Encounter for procedure for purposes other than remedying health state, unspecified: Secondary | ICD-10-CM | POA: Diagnosis not present

## 2022-06-05 DIAGNOSIS — Z419 Encounter for procedure for purposes other than remedying health state, unspecified: Secondary | ICD-10-CM | POA: Diagnosis not present

## 2024-04-10 ENCOUNTER — Emergency Department (HOSPITAL_BASED_OUTPATIENT_CLINIC_OR_DEPARTMENT_OTHER)

## 2024-04-10 ENCOUNTER — Encounter (HOSPITAL_BASED_OUTPATIENT_CLINIC_OR_DEPARTMENT_OTHER): Payer: Self-pay

## 2024-04-10 ENCOUNTER — Emergency Department (HOSPITAL_BASED_OUTPATIENT_CLINIC_OR_DEPARTMENT_OTHER): Admitting: Radiology

## 2024-04-10 ENCOUNTER — Emergency Department (HOSPITAL_BASED_OUTPATIENT_CLINIC_OR_DEPARTMENT_OTHER)
Admission: EM | Admit: 2024-04-10 | Discharge: 2024-04-10 | Disposition: A | Discharge status: Home / Self Care | Attending: Emergency Medicine | Admitting: Emergency Medicine

## 2024-04-10 ENCOUNTER — Other Ambulatory Visit: Payer: Self-pay

## 2024-04-10 DIAGNOSIS — M5481 Occipital neuralgia: Secondary | ICD-10-CM | POA: Insufficient documentation

## 2024-04-10 DIAGNOSIS — S161XXA Strain of muscle, fascia and tendon at neck level, initial encounter: Secondary | ICD-10-CM | POA: Diagnosis not present

## 2024-04-10 DIAGNOSIS — Z91040 Latex allergy status: Secondary | ICD-10-CM | POA: Diagnosis not present

## 2024-04-10 DIAGNOSIS — F419 Anxiety disorder, unspecified: Secondary | ICD-10-CM | POA: Insufficient documentation

## 2024-04-10 DIAGNOSIS — X509XXA Other and unspecified overexertion or strenuous movements or postures, initial encounter: Secondary | ICD-10-CM | POA: Diagnosis not present

## 2024-04-10 DIAGNOSIS — R000 Tachycardia, unspecified: Secondary | ICD-10-CM | POA: Diagnosis not present

## 2024-04-10 DIAGNOSIS — R079 Chest pain, unspecified: Secondary | ICD-10-CM | POA: Diagnosis not present

## 2024-04-10 DIAGNOSIS — M542 Cervicalgia: Secondary | ICD-10-CM | POA: Diagnosis present

## 2024-04-10 LAB — TROPONIN T, HIGH SENSITIVITY
Troponin T High Sensitivity: <15 ng/L (ref 0–19)
Troponin T High Sensitivity: <15 ng/L (ref 0–19)

## 2024-04-10 LAB — CBC
HCT: 39.6 % (ref 36.0–46.0)
Hemoglobin: 14 g/dL (ref 12.0–15.0)
MCH: 29.8 pg (ref 26.0–34.0)
MCHC: 35.4 g/dL (ref 30.0–36.0)
MCV: 84.3 fL (ref 80.0–100.0)
Platelets: 279 K/uL (ref 150–400)
RBC: 4.7 MIL/uL (ref 3.87–5.11)
RDW: 12.2 % (ref 11.5–15.5)
WBC: 5.4 K/uL (ref 4.0–10.5)
nRBC: 0 % (ref 0.0–0.2)

## 2024-04-10 LAB — BASIC METABOLIC PANEL WITH GFR
Anion gap: 12 (ref 5–15)
BUN: 10 mg/dL (ref 6–20)
CO2: 24 mmol/L (ref 22–32)
Calcium: 9.5 mg/dL (ref 8.9–10.3)
Chloride: 104 mmol/L (ref 98–111)
Creatinine, Ser: 0.87 mg/dL (ref 0.44–1.00)
GFR, Estimated: >60 mL/min
Glucose, Bld: 91 mg/dL (ref 70–99)
Potassium: 3.9 mmol/L (ref 3.5–5.1)
Sodium: 140 mmol/L (ref 135–145)

## 2024-04-10 LAB — PREGNANCY, URINE: Preg Test, Ur: NEGATIVE

## 2024-04-10 MED ORDER — MORPHINE SULFATE (PF) 2 MG/ML IV SOLN
2.0000 mg | Freq: Once | INTRAVENOUS | Status: AC
  Administered 2024-04-10: 2 mg via INTRAVENOUS
  Filled 2024-04-10: qty 1

## 2024-04-10 MED ORDER — METHYLPREDNISOLONE 4 MG PO TBPK: ORAL_TABLET | ORAL | 0 refills | Status: AC

## 2024-04-10 MED ORDER — DIAZEPAM 5 MG PO TABS
5.0000 mg | ORAL_TABLET | Freq: Once | ORAL | Status: AC
  Administered 2024-04-10: 5 mg via ORAL
  Filled 2024-04-10: qty 1

## 2024-04-10 MED ORDER — ALBUTEROL SULFATE HFA 108 (90 BASE) MCG/ACT IN AERS: 1.0000 | INHALATION_SPRAY | Freq: Four times a day (QID) | RESPIRATORY_TRACT | 0 refills | Status: AC | PRN

## 2024-04-10 MED ORDER — CYCLOBENZAPRINE HCL 5 MG PO TABS
5.0000 mg | ORAL_TABLET | Freq: Once | ORAL | Status: AC
  Administered 2024-04-10: 5 mg via ORAL
  Filled 2024-04-10: qty 1

## 2024-04-10 MED ORDER — DIAZEPAM 5 MG/ML IJ SOLN: 2.5000 mg | Freq: Once | INTRAMUSCULAR | Status: DC

## 2024-04-10 MED ORDER — LACTATED RINGERS IV BOLUS
1000.0000 mL | Freq: Once | INTRAVENOUS | Status: AC
  Administered 2024-04-10: 1000 mL via INTRAVENOUS

## 2024-04-10 MED ORDER — CYCLOBENZAPRINE HCL 10 MG PO TABS: 10.0000 mg | ORAL_TABLET | Freq: Two times a day (BID) | ORAL | 0 refills | Status: AC | PRN

## 2024-04-10 MED ORDER — IOHEXOL 350 MG/ML SOLN
75.0000 mL | Freq: Once | INTRAVENOUS | Status: AC | PRN
  Administered 2024-04-10: 100 mL via INTRAVENOUS

## 2024-04-10 MED ORDER — IOHEXOL 350 MG/ML SOLN
100.0000 mL | Freq: Once | INTRAVENOUS | Status: AC | PRN
  Administered 2024-04-10: 100 mL via INTRAVENOUS

## 2024-04-10 MED ORDER — BUPIVACAINE HCL 0.5 % IJ SOLN
20.0000 mL | Freq: Once | INTRAMUSCULAR | Status: AC
  Administered 2024-04-10: 10 mL
  Filled 2024-04-10: qty 1

## 2024-04-10 NOTE — ED Notes (Signed)
 Patient transported to CT

## 2024-04-10 NOTE — Discharge Instructions (Addendum)
 Your CT imaging was overall reassuring with no evidence of blood clot, fluid around your heart, ruptured lung or pneumonia.  Your CT imaging of the neck showed no evidence of dissection.  You likely sustained a muscle strain of your neck yesterday with associated muscle spasm.  Return for worsening neck rigidity and development of fever as this would be a concerning sign for meningitis.  Your evaluation was overall reassuring to include normal cardiac enzymes x 2.  CT imaging of the chest showed no abnormality, no evidence of blood clot in your lungs, no ruptured lung, no fluid around your heart, no pneumonia.  Your neck discomfort improved with an occipital nerve block which could be due to occipital neuralgia.  Your chest tightness and shortness of breath could be residual from your recent influenza infection could be indicative of bronchitis.  We will treat with albuterol  and a steroid pack.

## 2024-04-10 NOTE — ED Provider Notes (Signed)
 " [place] EMERGENCY DEPARTMENT AT [facility] Provider Note   CSN: [id number] Arrival date & time: 04/10/24  9341     Patient presents with: Chest Pain   Jill Pham is a 34 y.o. female.    Chest Pain    34 year old female with medical history significant for anxiety, depression presenting to the emergency department with a chief complaint of chest pain and shortness of breath.  Additionally, the patient states that she is having pain in her left posterior neck.  She denies any specific injury, denies any recent neck manipulation.  She states that she was putting up Christmas decorations yesterday.  Pain does not radiate down her arm.  Regarding her chest pain, it has been intermittent since yesterday, describes as a pressure sensation.  She feels like she was going to pass out.  Her heart rate has not been below 90 since this started.  Denies any history of DVT or PE.  She denies any lower extremity swelling or cramping.  She denies any fevers.  She was diagnosed with influenza 1 week ago but had been getting better.  Denies any vision loss, double vision, focal neurologic deficits such as numbness, weakness, facial droop, difficulty swallowing or speaking.  Prior to Admission medications  Medication Sig Start Date End Date Taking? Authorizing Provider  albuterol  (VENTOLIN  HFA) 108 (90 Base) MCG/ACT inhaler Inhale 1-2 puffs into the lungs every 6 (six) hours as needed for wheezing or shortness of breath. 04/10/24  Yes Jerrol Agent, MD  cyclobenzaprine  (FLEXERIL ) 10 MG tablet Take 1 tablet (10 mg total) by mouth 2 (two) times daily as needed for muscle spasms. 04/10/24  Yes Jerrol Agent, MD  methylPREDNISolone  (MEDROL  DOSEPAK) 4 MG TBPK tablet Take as directed on the box 04/10/24  Yes Jerrol Agent, MD  tretinoin (RETIN-A) 0.025 % cream Apply 1 Application topically as directed. Apply topically at bedtime. Start with twice a week for 2-4 weeks then increase to 3 times a week  for 2-4 weeks then every other day for 2-4 weeks 03/23/24  Yes [provider]  ZEPBOUND 5 MG/0.5ML Pen Inject 5 mg into the skin once a week. 07/15/23  Yes [provider]  etonogestrel -ethinyl estradiol  (NUVARING) 0.12-0.015 MG/24HR vaginal ring Place 1 each vaginally every 21 ( twenty-one) days. Insert vaginally and leave in place for 3 consecutive weeks, then remove for 1 week. 10/10/18   Neysa Inocente PARAS, NP  lamoTRIgine (LAMICTAL) 25 MG tablet Take by mouth.    [provider]  Levonorgestrel  ([name]) 19.5 MG IUD by Intrauterine route.    [provider]  megestrol  (MEGACE ) 20 MG tablet Take twice daily until bleeding stops 11/18/18   Neysa Inocente PARAS, NP  metroNIDAZOLE  (FLAGYL ) 500 MG tablet Take 1 tablet (500 mg total) by mouth 2 (two) times daily with a meal. 10/12/18   Neysa Inocente PARAS, NP  NON FORMULARY Cream for face    [provider]  zolpidem  (AMBIEN  CR) 6.25 MG CR tablet Take 1 tablet (6.25 mg total) by mouth as needed. 08/28/16   Jerrell Niels CROME, MD    Allergies: Latex    Review of Systems  Cardiovascular:  Positive for chest pain.  Musculoskeletal:  Positive for neck pain.  All other systems reviewed and are negative.   Updated Vital Signs BP 102/73 (BP Location: Right Arm)   Pulse 65   Temp 98.6 F (37 C) (Oral)   Resp 16   SpO2 100%   Physical Exam Vitals and  nursing note reviewed.  Constitutional:      General: She is not in acute distress.    Appearance: She is well-developed.  HENT:     Head: Normocephalic and atraumatic.  Eyes:     Conjunctiva/sclera: Conjunctivae normal.  Neck:     Comments: TTP of the soft tissue of the neck, some focal TTP of the occipital foramen, no midline TTP. No meningismus Cardiovascular:     Rate and Rhythm: Regular rhythm. Tachycardia present.     Heart sounds: No murmur heard. Pulmonary:     Effort: Pulmonary effort is normal. No respiratory distress.     Breath sounds: Normal breath  sounds. No wheezing, rhonchi or rales.  Abdominal:     Palpations: Abdomen is soft.     Tenderness: There is no abdominal tenderness.  Musculoskeletal:        General: No swelling.     Cervical back: Neck supple.     Right lower leg: No edema.     Left lower leg: No edema.  Skin:    General: Skin is warm and dry.     Capillary Refill: Capillary refill takes less than 2 seconds.  Neurological:     Mental Status: She is alert.  Psychiatric:        Mood and Affect: Mood is anxious. Affect is tearful.     (all labs ordered are listed, but only abnormal results are displayed) Labs Reviewed  BASIC METABOLIC PANEL WITH GFR  CBC  PREGNANCY, URINE  TROPONIN T, HIGH SENSITIVITY  TROPONIN T, HIGH SENSITIVITY    EKG: EKG Interpretation Date/Time:  Monday April 10 2024 07:08:20 EST Ventricular Rate:  110 PR Interval:  128 QRS Duration:  72 QT Interval:  318 QTC Calculation: 430 R Axis:   99  Text Interpretation: Sinus tachycardia Right atrial enlargement Rightward axis Pulmonary disease pattern Abnormal ECG No previous ECGs available Confirmed by Jerrol Agent ([id number]) on 04/10/2024 8:11:29 AM  Radiology: CT ANGIO HEAD NECK W WO CM Result Date: 04/10/2024 EXAM: CTA HEAD AND NECK WITH AND WITHOUT 04/10/2024 11:18:15 AM TECHNIQUE: CTA of the head and neck was performed with and without the administration of intravenous contrast. Multiplanar 2D and/or 3D reformatted images are provided for review. Automated exposure control, iterative reconstruction, and/or weight based adjustment of the mA/kV was utilized to reduce the radiation dose to as low as reasonably achievable. Stenosis of the internal carotid arteries measured using NASCET criteria. COMPARISON: None available CLINICAL HISTORY: R/out vertebral dissection FINDINGS: AORTIC ARCH AND ARCH VESSELS: No dissection or arterial injury. No significant stenosis of the brachiocephalic or subclavian arteries. CERVICAL CAROTID ARTERIES: No  dissection, arterial injury, or hemodynamically significant stenosis by NASCET criteria. CERVICAL VERTEBRAL ARTERIES: No dissection, arterial injury, or significant stenosis. LUNGS AND MEDIASTINUM: Unremarkable. SOFT TISSUES: No acute abnormality. BONES: No acute abnormality. ANTERIOR CIRCULATION: No significant stenosis of the internal carotid arteries. No significant stenosis of the anterior cerebral arteries. No significant stenosis of the middle cerebral arteries. No aneurysm. POSTERIOR CIRCULATION: No significant stenosis of the posterior cerebral arteries. No significant stenosis of the basilar artery. No significant stenosis of the vertebral arteries. No aneurysm. OTHER: No dural venous sinus thrombosis on this non-dedicated study. IMPRESSION: 1. No large vessel occlusion, hemodynamically significant stenosis, or aneurysm in the head or neck. Electronically signed by: Evalene Coho MD 04/10/2024 11:30 AM EST RP Workstation: [id number]   CT ANGIO HEAD NECK W WO CM Result Date: 04/10/2024 EXAM: CTA Head and Neck with Intravenous Contrast. CT Head  without Contrast. CLINICAL HISTORY: Rule out vertebral dissection. TECHNIQUE: Axial CTA images of the head and neck performed with intravenous contrast. MIP reconstructed images were created and reviewed. Axial computed tomography images of the head/brain performed without intravenous contrast. Note: Per PQRS, the description of internal carotid artery percent stenosis, including 0 percent or normal exam, is based on [employer] Symptomatic Carotid Endarterectomy Trial (NASCET) criteria. Dose reduction technique was used including one or more of the following: automated exposure control, adjustment of mA and kV according to patient size, and/or iterative reconstruction. CONTRAST: Without and with IV contrast; 100 mL iohexol  (OMNIPAQUE ) 350 MG/ML injection. COMPARISON: CT cervical spine 09/11/2012. FINDINGS: CT HEAD: BRAIN: No acute intraparenchymal hemorrhage.  No mass lesion. No CT evidence for acute territorial infarct. No midline shift or extra-axial collection. VENTRICLES: No hydrocephalus. ORBITS: The orbits are unremarkable. SINUSES AND MASTOIDS: The paranasal sinuses and mastoid air cells are clear. CTA images are degraded by venous contamination/delayed phase of imaging and streak artifact. CTA NECK: COMMON CAROTID ARTERIES: No significant stenosis. No dissection or occlusion. INTERNAL CAROTID ARTERIES: No stenosis by NASCET criteria. No dissection or occlusion. Limited evaluation of the mid bilateral internal carotid artery secondary to streak artifact. VERTEBRAL ARTERIES: Limited evaluation due to venous contamination. No significant stenosis. No dissection or occlusion. CTA HEAD: ANTERIOR CEREBRAL ARTERIES: No significant stenosis. No occlusion. No aneurysm. MIDDLE CEREBRAL ARTERIES: No significant stenosis. No occlusion. No aneurysm. POSTERIOR CEREBRAL ARTERIES: No significant stenosis. No occlusion. No aneurysm. BASILAR ARTERY: No significant stenosis. No occlusion. No aneurysm. OTHER: SOFT TISSUES: No acute finding. No masses or lymphadenopathy. BONES: No acute osseous abnormality. IMPRESSION: 1. CT Head: No acute intracranial hemorrhage or acute territorial infarction. 2. CTA: No large vessel occlusion. 3. CTA limited by venous contamination/delayed phase of imaging. Within this limitation, no evidence of vertebral artery dissection is seen. 4. Limited evaluation of the bilateral mid cervical internal carotid artery secondary to streak artifact. 5. If there is high or persistent clinical concern for arterial injury, consider MRA or repeat CTA. Electronically signed by: Prentice Spade MD 04/10/2024 10:14 AM EST RP Workstation: [id number]   CT Angio Chest PE W and/or Wo Contrast Result Date: 04/10/2024 EXAM: CTA CHEST 04/10/2024 08:58:05 AM TECHNIQUE: CTA of the chest was performed after the administration of intravenous contrast. Multiplanar reformatted  images are provided for review. MIP images are provided for review. Automated exposure control, iterative reconstruction, and/or weight based adjustment of the mA/kV was utilized to reduce the radiation dose to as low as reasonably achievable. COMPARISON: None available. CLINICAL HISTORY: Pulmonary embolism (PE) suspected, high prob FINDINGS: PULMONARY ARTERIES: Pulmonary arteries are adequately opacified for evaluation. No acute pulmonary embolus. Main pulmonary artery is normal in caliber. MEDIASTINUM: The heart and pericardium demonstrate no acute abnormality. There is no acute abnormality of the thoracic aorta. LYMPH NODES: No mediastinal, hilar or axillary lymphadenopathy. LUNGS AND PLEURA: The lungs are without acute process. No focal consolidation or pulmonary edema. No evidence of pleural effusion or pneumothorax. UPPER ABDOMEN: Limited images of the upper abdomen are unremarkable. SOFT TISSUES AND BONES: No acute bone or soft tissue abnormality. IMPRESSION: 1. No pulmonary embolism or acute pulmonary abnormality. Electronically signed by: Evalene Coho MD 04/10/2024 09:37 AM EST RP Workstation: [id number]   DG Chest 2 View Result Date: 04/10/2024 CLINICAL DATA:  Chest pain.  Shortness of breath.  Weakness. EXAM: CHEST - 2 VIEW COMPARISON:  05/16/2009 FINDINGS: Cardiomediastinal silhouette and pulmonary vasculature are within normal limits. Lungs are clear. IMPRESSION: No acute cardiopulmonary  process. Electronically Signed   By: Aliene Lloyd M.D.   On: 04/10/2024 07:44     .Nerve Block  Date/Time: 04/10/2024 1:00 PM  Performed by: Jerrol Agent, MD Authorized by: Jerrol Agent, MD   Consent:    Consent obtained:  Verbal   Consent given by:  Patient   Risks discussed:  Allergic reaction, infection, swelling and unsuccessful block   Alternatives discussed:  No treatment Universal protocol:    Patient identity confirmed:  Verbally with patient Location:    Body area:  Head   Head nerve:   Occipital   Laterality:  Left Pre-procedure details:    Skin preparation:  Chlorhexidine   Preparation: Patient was prepped and draped in usual sterile fashion   Procedure details:    Block needle gauge:  25 G   Anesthetic injected:  10 mL bupivacaine  (MARCAINE ) 0.5 % (with pres) injection   Steroid injected:  None   Additive injected:  None   Injection procedure:  Anatomic landmarks identified, incremental injection, negative aspiration for blood, introduced needle and anatomic landmarks palpated   Paresthesia:  None Post-procedure details:    Dressing:  None   Outcome:  Pain relieved   Procedure completion:  Tolerated    Medications Ordered in the ED  bupivacaine  (MARCAINE ) 0.5 % (with pres) injection 20 mL (has no administration in time range)  lactated ringers  bolus 1,000 mL (0 mLs Intravenous Stopped 04/10/24 0916)  morphine  (PF) 2 MG/ML injection 2 mg (2 mg Intravenous Given 04/10/24 0840)  iohexol  (OMNIPAQUE ) 350 MG/ML injection 100 mL (100 mLs Intravenous Contrast Given 04/10/24 0848)  diazepam  (VALIUM ) tablet 5 mg (5 mg Oral Given 04/10/24 0901)  cyclobenzaprine  (FLEXERIL ) tablet 5 mg (5 mg Oral Given 04/10/24 1117)  iohexol  (OMNIPAQUE ) 350 MG/ML injection 75 mL (100 mLs Intravenous Contrast Given 04/10/24 1112)                HEART Score: 1                    Medical Decision Making Amount and/or Complexity of Data Reviewed Labs: ordered. Radiology: ordered.  Risk Prescription drug management.    34 year old female with medical history significant for anxiety, depression presenting to the emergency department with a chief complaint of chest pain and shortness of breath.  Additionally, the patient states that she is having pain in her left posterior neck.  She denies any specific injury, denies any recent neck manipulation.  She states that she was putting up Christmas decorations yesterday.  Pain does not radiate down her arm.  Regarding her chest pain, it has been  intermittent since yesterday, describes as a pressure sensation.  She feels like she was going to pass out.  Her heart rate has not been below 90 since this started.  Denies any history of DVT or PE.  She denies any lower extremity swelling or cramping.  She denies any fevers.  She was diagnosed with influenza 1 week ago but had been getting better.  Denies any vision loss, double vision, focal neurologic deficits such as numbness, weakness, facial droop, difficulty swallowing or speaking.  The patient states that she recently just got over the flu, had it over christmas.  Medical Decision Making: Dorrene N Hink is a 34 y.o. female who presented to the ED today with chest pain, detailed above.  Based on patient's comorbidities, patient has a heart score of 1.     Complete initial physical exam performed, notably the patient  was CTAB, neuro intact, TTP of the left posterior neck about the occipital foramen, no meningismus.   Reviewed and confirmed nursing documentation for past medical history, family history, social history.    Initial Assessment: With the patient's presentation of left-sided chest pain, most likely diagnosis is PE vs acute bronchitis. Considered musculoskeletal chest pain versus GERD, and ACS remains on the differential. Other diagnoses were considered including (but not limited to) community-acquired pneumonia, aortic dissection, pneumothorax, underlying bony abnormality, anemia, pericarditis, anxiety. These are considered less likely due to history of present illness and physical exam findings.   Also regarding the patient's neck pain, suspect MSK strain in the setting of recent physical exertion yesterday putting away [employer]. Considered vertebral dissection. Low concern for traumatic injury. Pt afebrile, no meningismus, low concern for meningitis.  In particular, concerning pulmonary embolism: Patient is PERC positive and the they deny malignancy, recent surgery,  history of DVT, or calf tenderness leading to a low risk Wells score. Aortic Dissection also considered but seems less likely based on the location, quality, onset, and severity of symptoms in this case.   Initial Plan: Evaluate for ACS with delta troponin and EKG evaluated as below  CTA PE study CTA Head and Neck Evaluate for dissection, bony abnormality, or pneumonia with chest x-ray and screening laboratory evaluation including CBC, BMP  Further evaluation for pulmonary embolism indicated at this time based on patient's PERC and Wells score.  Further evaluation for Thoracic Aortic Dissection not indicated at this time based on patient's clinical history and PE findings.   Initial Study Results: EKG was reviewed independently. Rate, rhythm, axis, intervals all examined and without medically relevant abnormality. ST segments without concerns for elevations.    Laboratory  Delta troponin demonstrated normal values  CBC and BMP without obvious metabolic or inflammatory abnormalities requiring further evaluation   Radiology  CT ANGIO HEAD NECK W WO CM Result Date: 04/10/2024 EXAM: CTA HEAD AND NECK WITH AND WITHOUT 04/10/2024 11:18:15 AM TECHNIQUE: CTA of the head and neck was performed with and without the administration of intravenous contrast. Multiplanar 2D and/or 3D reformatted images are provided for review. Automated exposure control, iterative reconstruction, and/or weight based adjustment of the mA/kV was utilized to reduce the radiation dose to as low as reasonably achievable. Stenosis of the internal carotid arteries measured using NASCET criteria. COMPARISON: None available CLINICAL HISTORY: R/out vertebral dissection FINDINGS: AORTIC ARCH AND ARCH VESSELS: No dissection or arterial injury. No significant stenosis of the brachiocephalic or subclavian arteries. CERVICAL CAROTID ARTERIES: No dissection, arterial injury, or hemodynamically significant stenosis by NASCET criteria. CERVICAL  VERTEBRAL ARTERIES: No dissection, arterial injury, or significant stenosis. LUNGS AND MEDIASTINUM: Unremarkable. SOFT TISSUES: No acute abnormality. BONES: No acute abnormality. ANTERIOR CIRCULATION: No significant stenosis of the internal carotid arteries. No significant stenosis of the anterior cerebral arteries. No significant stenosis of the middle cerebral arteries. No aneurysm. POSTERIOR CIRCULATION: No significant stenosis of the posterior cerebral arteries. No significant stenosis of the basilar artery. No significant stenosis of the vertebral arteries. No aneurysm. OTHER: No dural venous sinus thrombosis on this non-dedicated study. IMPRESSION: 1. No large vessel occlusion, hemodynamically significant stenosis, or aneurysm in the head or neck. Electronically signed by: Evalene Coho MD 04/10/2024 11:30 AM EST RP Workstation: [id number]   CT ANGIO HEAD NECK W WO CM Result Date: 04/10/2024 EXAM: CTA Head and Neck with Intravenous Contrast. CT Head without Contrast. CLINICAL HISTORY: Rule out vertebral dissection. TECHNIQUE: Axial CTA images of the head and  neck performed with intravenous contrast. MIP reconstructed images were created and reviewed. Axial computed tomography images of the head/brain performed without intravenous contrast. Note: Per PQRS, the description of internal carotid artery percent stenosis, including 0 percent or normal exam, is based on [employer] American Symptomatic Carotid Endarterectomy Trial (NASCET) criteria. Dose reduction technique was used including one or more of the following: automated exposure control, adjustment of mA and kV according to patient size, and/or iterative reconstruction. CONTRAST: Without and with IV contrast; 100 mL iohexol  (OMNIPAQUE ) 350 MG/ML injection. COMPARISON: CT cervical spine 09/11/2012. FINDINGS: CT HEAD: BRAIN: No acute intraparenchymal hemorrhage. No mass lesion. No CT evidence for acute territorial infarct. No midline shift or extra-axial  collection. VENTRICLES: No hydrocephalus. ORBITS: The orbits are unremarkable. SINUSES AND MASTOIDS: The paranasal sinuses and mastoid air cells are clear. CTA images are degraded by venous contamination/delayed phase of imaging and streak artifact. CTA NECK: COMMON CAROTID ARTERIES: No significant stenosis. No dissection or occlusion. INTERNAL CAROTID ARTERIES: No stenosis by NASCET criteria. No dissection or occlusion. Limited evaluation of the mid bilateral internal carotid artery secondary to streak artifact. VERTEBRAL ARTERIES: Limited evaluation due to venous contamination. No significant stenosis. No dissection or occlusion. CTA HEAD: ANTERIOR CEREBRAL ARTERIES: No significant stenosis. No occlusion. No aneurysm. MIDDLE CEREBRAL ARTERIES: No significant stenosis. No occlusion. No aneurysm. POSTERIOR CEREBRAL ARTERIES: No significant stenosis. No occlusion. No aneurysm. BASILAR ARTERY: No significant stenosis. No occlusion. No aneurysm. OTHER: SOFT TISSUES: No acute finding. No masses or lymphadenopathy. BONES: No acute osseous abnormality. IMPRESSION: 1. CT Head: No acute intracranial hemorrhage or acute territorial infarction. 2. CTA: No large vessel occlusion. 3. CTA limited by venous contamination/delayed phase of imaging. Within this limitation, no evidence of vertebral artery dissection is seen. 4. Limited evaluation of the bilateral mid cervical internal carotid artery secondary to streak artifact. 5. If there is high or persistent clinical concern for arterial injury, consider MRA or repeat CTA. Electronically signed by: Prentice Spade MD 04/10/2024 10:14 AM EST RP Workstation: [id number]   CT Angio Chest PE W and/or Wo Contrast Result Date: 04/10/2024 EXAM: CTA CHEST 04/10/2024 08:58:05 AM TECHNIQUE: CTA of the chest was performed after the administration of intravenous contrast. Multiplanar reformatted images are provided for review. MIP images are provided for review. Automated exposure control,  iterative reconstruction, and/or weight based adjustment of the mA/kV was utilized to reduce the radiation dose to as low as reasonably achievable. COMPARISON: None available. CLINICAL HISTORY: Pulmonary embolism (PE) suspected, high prob FINDINGS: PULMONARY ARTERIES: Pulmonary arteries are adequately opacified for evaluation. No acute pulmonary embolus. Main pulmonary artery is normal in caliber. MEDIASTINUM: The heart and pericardium demonstrate no acute abnormality. There is no acute abnormality of the thoracic aorta. LYMPH NODES: No mediastinal, hilar or axillary lymphadenopathy. LUNGS AND PLEURA: The lungs are without acute process. No focal consolidation or pulmonary edema. No evidence of pleural effusion or pneumothorax. UPPER ABDOMEN: Limited images of the upper abdomen are unremarkable. SOFT TISSUES AND BONES: No acute bone or soft tissue abnormality. IMPRESSION: 1. No pulmonary embolism or acute pulmonary abnormality. Electronically signed by: Evalene Coho MD 04/10/2024 09:37 AM EST RP Workstation: [id number]   DG Chest 2 View Result Date: 04/10/2024 CLINICAL DATA:  Chest pain.  Shortness of breath.  Weakness. EXAM: CHEST - 2 VIEW COMPARISON:  05/16/2009 FINDINGS: Cardiomediastinal silhouette and pulmonary vasculature are within normal limits. Lungs are clear. IMPRESSION: No acute cardiopulmonary process. Electronically Signed   By: Aliene Lloyd M.D.   On: 04/10/2024 07:44  Final Assessment and Plan: CTA PE study without evidence of pneumonia, pneumothorax, pericardial effusion, pulmonary embolism.  Patient has no right upper quadrant tenderness to suspect biliary etiology.  CTA head and neck initially with poor contrast timing with venous phase contamination, repeat had been recommended which was obtained and ultimately resulted negative.  No evidence for vertebral dissection.  No acute bony abnormality.  Low concern for meningitis.  On repeat examination, patient continued to endorse  left-sided posterior neck pain, had improved somewhat with Valium .  She has focal tenderness about the occipital foramen.  Considered occipital neuralgia as a contributing factor versus persistent MSK strain.  Patient was consented for occipital nerve block which was performed as per the procedure note above with subsequent resolution of the patient's symptoms.  Patient has a heart score of 1, has negative troponins x 2, reassuring CT angiogram of the chest.  Low concern for acute intrathoracic abnormality which would prompt further investigation inpatient.  Patient could be experiencing symptoms of bronchitis post influenza however she has no cough, just pleuritic discomfort and shortness of breath.  Will trial a course of Medrol  Dosepak and albuterol .  Her lungs are clear to auscultation on repeat evaluation and she is in no respiratory distress with stable vitals.  With concern for potential MSK strain, will discharge on Flexeril .  Patient with reassuring workup in the emergency department, symptomatically improved on repeat assessment, overall stable for discharge at this time with continued outpatient follow-up.      Final diagnoses:  Strain of neck muscle, initial encounter  Chest pain, unspecified type  Occipital neuralgia of left side    ED Discharge Orders          Ordered    cyclobenzaprine  (FLEXERIL ) 10 MG tablet  2 times daily PRN        04/10/24 1154    methylPREDNISolone  (MEDROL  DOSEPAK) 4 MG TBPK tablet        04/10/24 1334    albuterol  (VENTOLIN  HFA) 108 (90 Base) MCG/ACT inhaler  Every 6 hours PRN        04/10/24 1334               Jerrol Agent, MD 04/10/24 1755  "

## 2024-04-10 NOTE — ED Notes (Signed)
 DC paperwork given and verbally understood.

## 2024-04-10 NOTE — ED Triage Notes (Signed)
 Patient reports intermittent chest pain since yesterday and shortness of breath. Also feels some weakness. She says it radiates to her neck. She is tearful in triage. Describes it as a pressure. Denies personal hx of cardiac problems.
